# Patient Record
Sex: Male | Born: 1985 | Race: White | Hispanic: No | Marital: Single | Smoking: Never smoker
Health system: Southern US, Community
[De-identification: ages and names within clinical notes are randomized; demographics above are authoritative.]

## PROBLEM LIST (undated history)

## (undated) DIAGNOSIS — R41840 Attention and concentration deficit: Secondary | ICD-10-CM

## (undated) DIAGNOSIS — G4730 Sleep apnea, unspecified: Secondary | ICD-10-CM

## (undated) DIAGNOSIS — S069X9A Unspecified intracranial injury with loss of consciousness of unspecified duration, initial encounter: Secondary | ICD-10-CM

## (undated) DIAGNOSIS — S069XAA Unspecified intracranial injury with loss of consciousness status unknown, initial encounter: Secondary | ICD-10-CM

## (undated) DIAGNOSIS — G4700 Insomnia, unspecified: Secondary | ICD-10-CM

## (undated) HISTORY — PX: WISDOM TOOTH EXTRACTION: SHX21

## (undated) HISTORY — DX: Unspecified intracranial injury with loss of consciousness of unspecified duration, initial encounter: S06.9X9A

## (undated) HISTORY — PX: NOSE SURGERY: SHX723

## (undated) HISTORY — DX: Unspecified intracranial injury with loss of consciousness status unknown, initial encounter: S06.9XAA

## (undated) HISTORY — PX: FEMUR FRACTURE SURGERY: SHX633

---
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
## 2005-10-03 ENCOUNTER — Emergency Department (HOSPITAL_COMMUNITY): Admission: EM | Admit: 2005-10-03 | Discharge: 2005-10-03 | Payer: Self-pay | Admitting: Emergency Medicine

## 2008-01-21 ENCOUNTER — Inpatient Hospital Stay (HOSPITAL_COMMUNITY): Admission: AC | Admit: 2008-01-21 | Discharge: 2008-02-20 | Payer: Self-pay

## 2008-01-30 ENCOUNTER — Encounter (INDEPENDENT_AMBULATORY_CARE_PROVIDER_SITE_OTHER): Payer: Self-pay | Admitting: General Surgery

## 2008-01-30 ENCOUNTER — Ambulatory Visit: Payer: Self-pay | Admitting: Vascular Surgery

## 2008-02-03 ENCOUNTER — Ambulatory Visit: Payer: Self-pay | Admitting: Internal Medicine

## 2008-02-07 ENCOUNTER — Ambulatory Visit: Payer: Self-pay | Admitting: Physical Medicine & Rehabilitation

## 2008-02-20 ENCOUNTER — Ambulatory Visit: Payer: Self-pay | Admitting: Physical Medicine & Rehabilitation

## 2008-02-20 ENCOUNTER — Inpatient Hospital Stay (HOSPITAL_COMMUNITY)
Admission: RE | Admit: 2008-02-20 | Discharge: 2008-04-23 | Payer: Self-pay | Admitting: Physical Medicine & Rehabilitation

## 2008-04-12 ENCOUNTER — Ambulatory Visit: Payer: Self-pay | Admitting: Physical Medicine & Rehabilitation

## 2008-05-08 ENCOUNTER — Encounter: Admission: RE | Admit: 2008-05-08 | Discharge: 2008-05-08 | Payer: Self-pay | Admitting: Neurological Surgery

## 2008-06-27 ENCOUNTER — Encounter
Admission: RE | Admit: 2008-06-27 | Discharge: 2008-09-25 | Payer: Self-pay | Admitting: Physical Medicine and Rehabilitation

## 2008-06-29 ENCOUNTER — Ambulatory Visit (HOSPITAL_COMMUNITY): Admission: RE | Admit: 2008-06-29 | Discharge: 2008-06-29 | Payer: Self-pay | Admitting: General Surgery

## 2008-07-04 ENCOUNTER — Encounter
Admission: RE | Admit: 2008-07-04 | Discharge: 2008-07-06 | Payer: Self-pay | Admitting: Physical Medicine & Rehabilitation

## 2008-07-06 ENCOUNTER — Ambulatory Visit: Payer: Self-pay | Admitting: Physical Medicine & Rehabilitation

## 2008-07-18 ENCOUNTER — Ambulatory Visit: Payer: Self-pay | Admitting: Psychology

## 2008-07-18 ENCOUNTER — Encounter: Admission: RE | Admit: 2008-07-18 | Discharge: 2008-10-16 | Payer: Self-pay | Admitting: Psychology

## 2008-07-24 ENCOUNTER — Encounter: Admission: RE | Admit: 2008-07-24 | Discharge: 2008-07-24 | Payer: Self-pay | Admitting: Neurological Surgery

## 2008-09-17 ENCOUNTER — Encounter: Admission: RE | Admit: 2008-09-17 | Discharge: 2008-10-01 | Payer: Self-pay | Admitting: Family Medicine

## 2008-09-17 ENCOUNTER — Ambulatory Visit: Payer: Self-pay | Admitting: Psychology

## 2008-10-09 ENCOUNTER — Encounter: Admission: RE | Admit: 2008-10-09 | Discharge: 2008-10-09 | Payer: Self-pay | Admitting: Neurological Surgery

## 2008-10-19 ENCOUNTER — Ambulatory Visit (HOSPITAL_COMMUNITY): Admission: RE | Admit: 2008-10-19 | Discharge: 2008-10-19 | Payer: Self-pay | Admitting: Neurological Surgery

## 2008-10-31 ENCOUNTER — Encounter: Admission: RE | Admit: 2008-10-31 | Discharge: 2008-10-31 | Payer: Self-pay | Admitting: Neurological Surgery

## 2008-11-19 ENCOUNTER — Ambulatory Visit (HOSPITAL_COMMUNITY): Admission: RE | Admit: 2008-11-19 | Discharge: 2008-11-19 | Payer: Self-pay | Admitting: Neurological Surgery

## 2009-01-28 IMAGING — CR DG ABD PORTABLE 1V
1 series · 1 of 1 positions shown · non-contrast
Comparison: None.

CLINICAL DATA: Feeding tube placement.

ABDOMEN - 1 VIEW

[view not recorded]
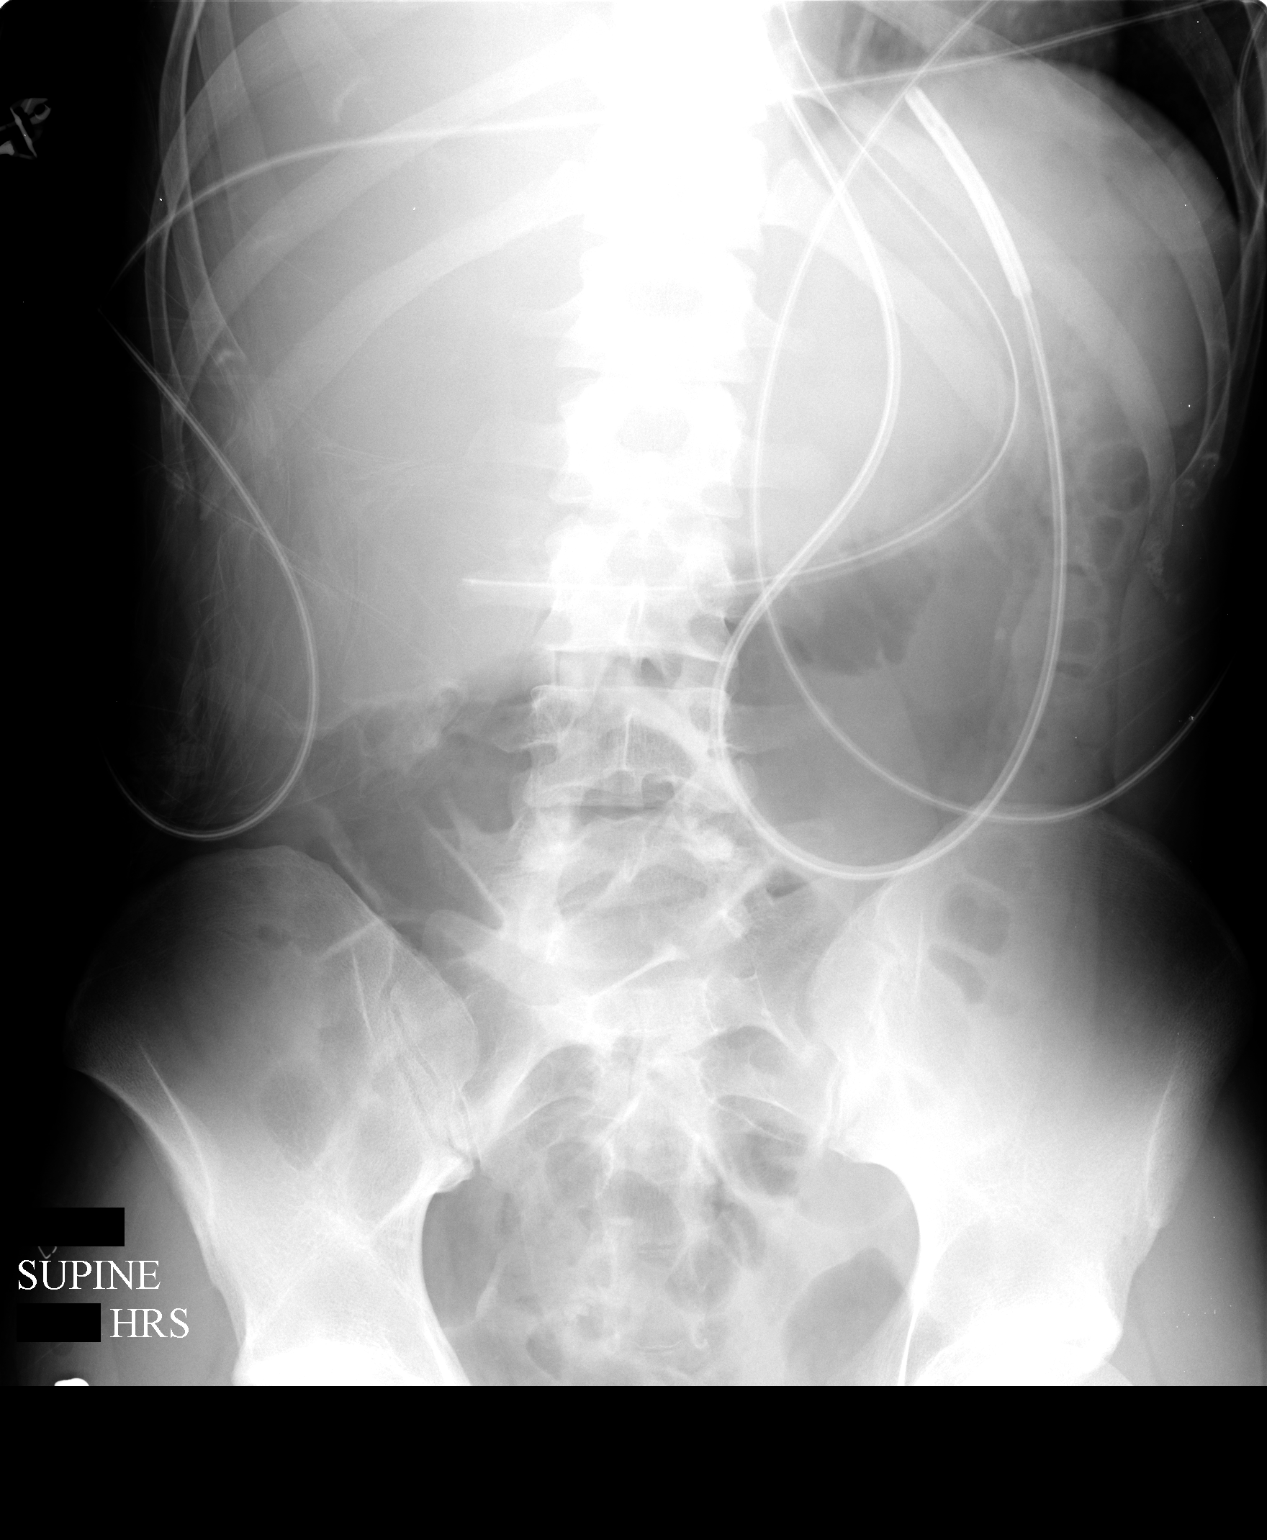

[1 of 1 positions shown; findings below may reference images not displayed]

FINDINGS: Feeding tube is coiled in the stomach.  Nasogastric
terminates in the peripyloric region.
IMPRESSION: Feeding tube coiled in the stomach.
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
## 2009-01-30 IMAGING — CR DG CHEST 1V PORT
1 series · 1 of 1 positions shown · non-contrast
Comparison: 02/01/2008.

CLINICAL DATA: Endotracheal tube placement.

PORTABLE CHEST - 1 VIEW

[view not recorded]
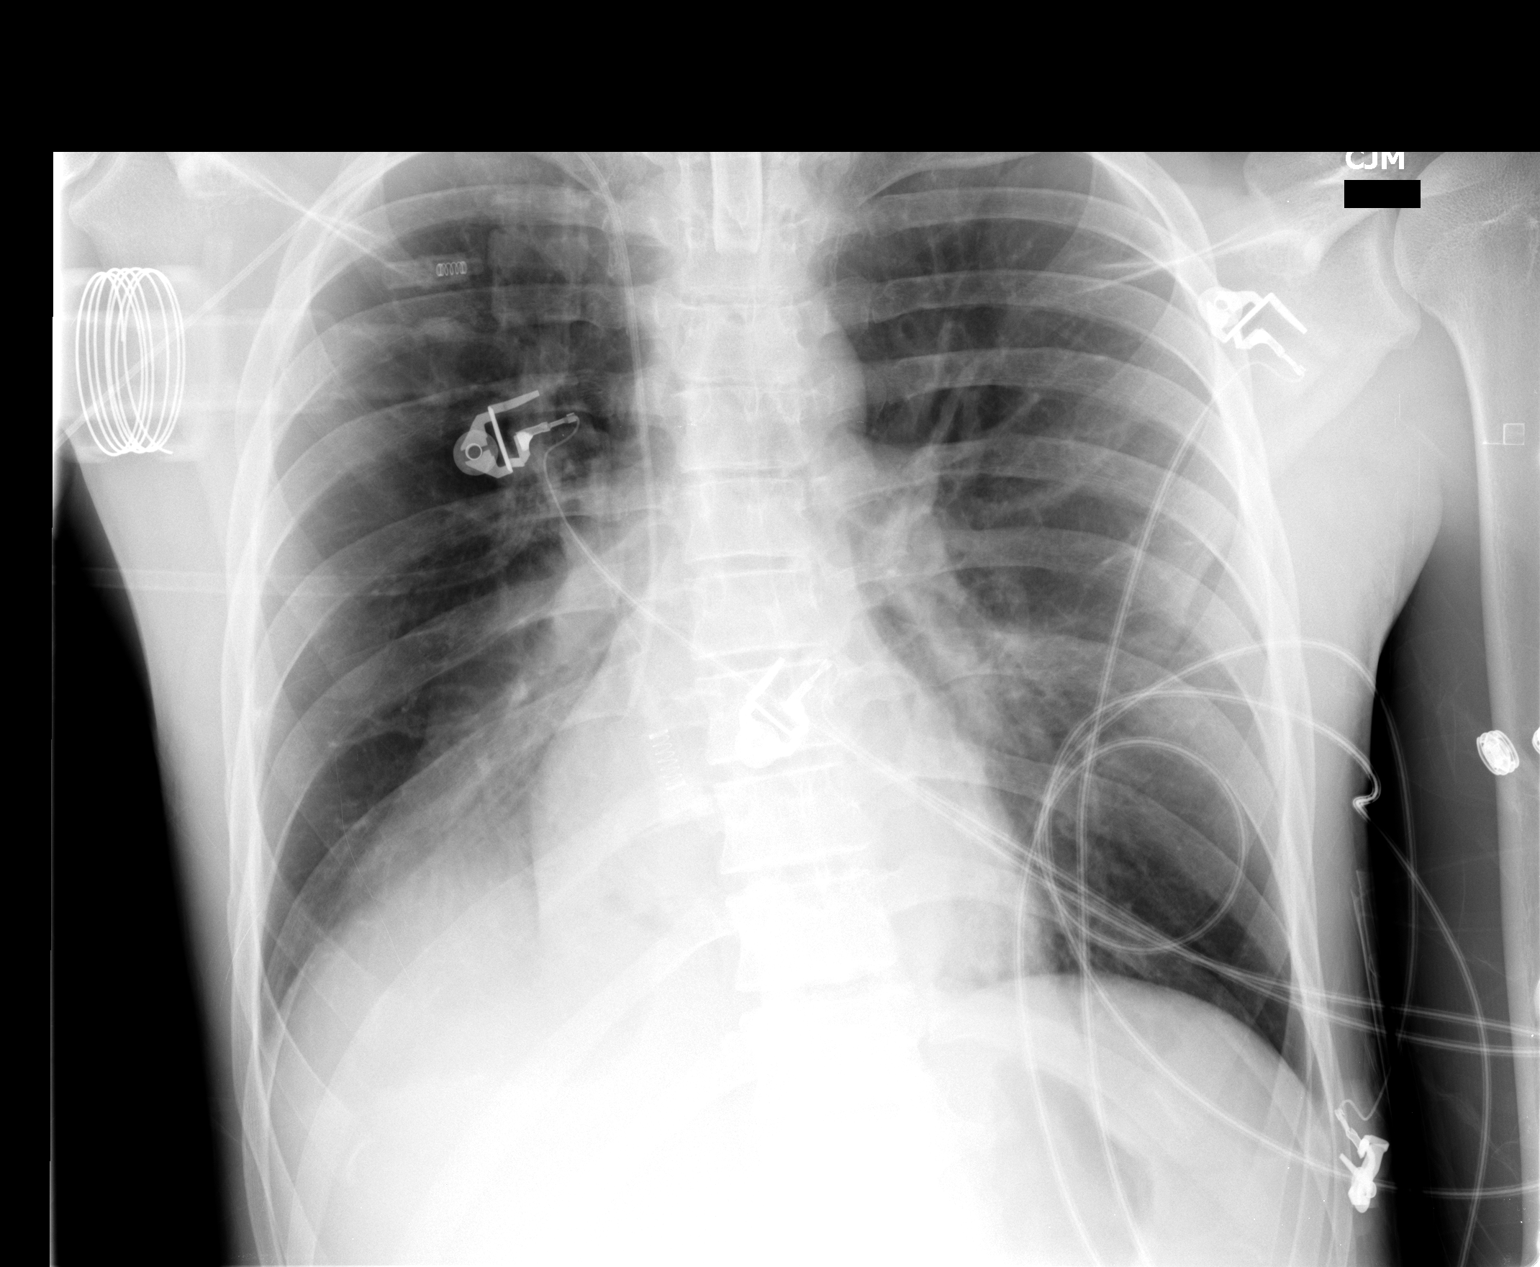

[1 of 1 positions shown; findings below may reference images not displayed]

FINDINGS: Heart size stable.  Right lower lobe
collapse/consolidation.  No pleural fluid.
IMPRESSION: 1.  Interval tracheostomy placement.
2.  Right lower lobe collapse/consolidation.

## 2009-02-01 IMAGING — CR DG CHEST 1V PORT
1 series · 1 of 1 positions shown · non-contrast
Comparison: 02/01/2008

CLINICAL DATA: Intoxication

PORTABLE CHEST - 1 VIEW

[view not recorded]
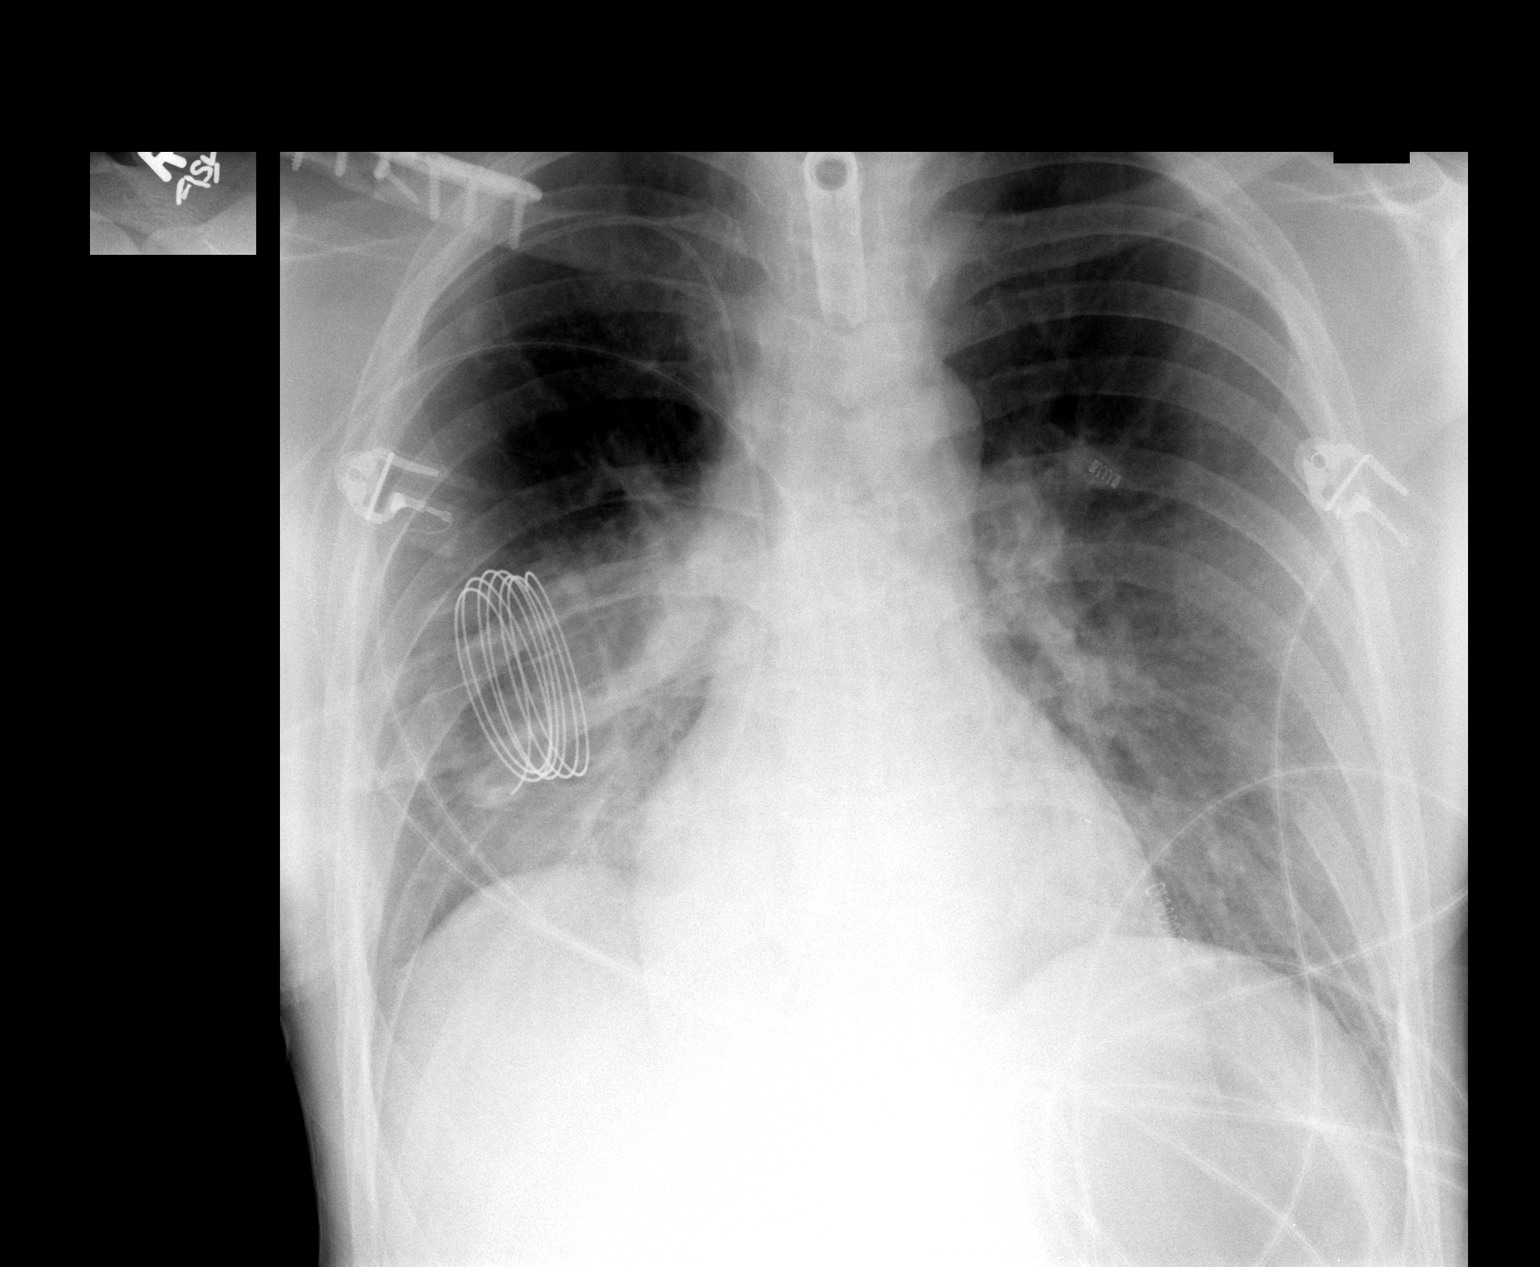

[1 of 1 positions shown; findings below may reference images not displayed]

FINDINGS: Support devices are unchanged.  Continued area of
consolidation in the right lower lobe, not significantly changed.
Mild cardiomegaly.
IMPRESSION: No significant change.

## 2009-02-04 IMAGING — CR DG CHEST 1V PORT
1 series · 1 of 1 positions shown · non-contrast
Comparison: 02/04/2008.

CLINICAL DATA: Head injury.

PORTABLE CHEST - 1 VIEW

[view not recorded]
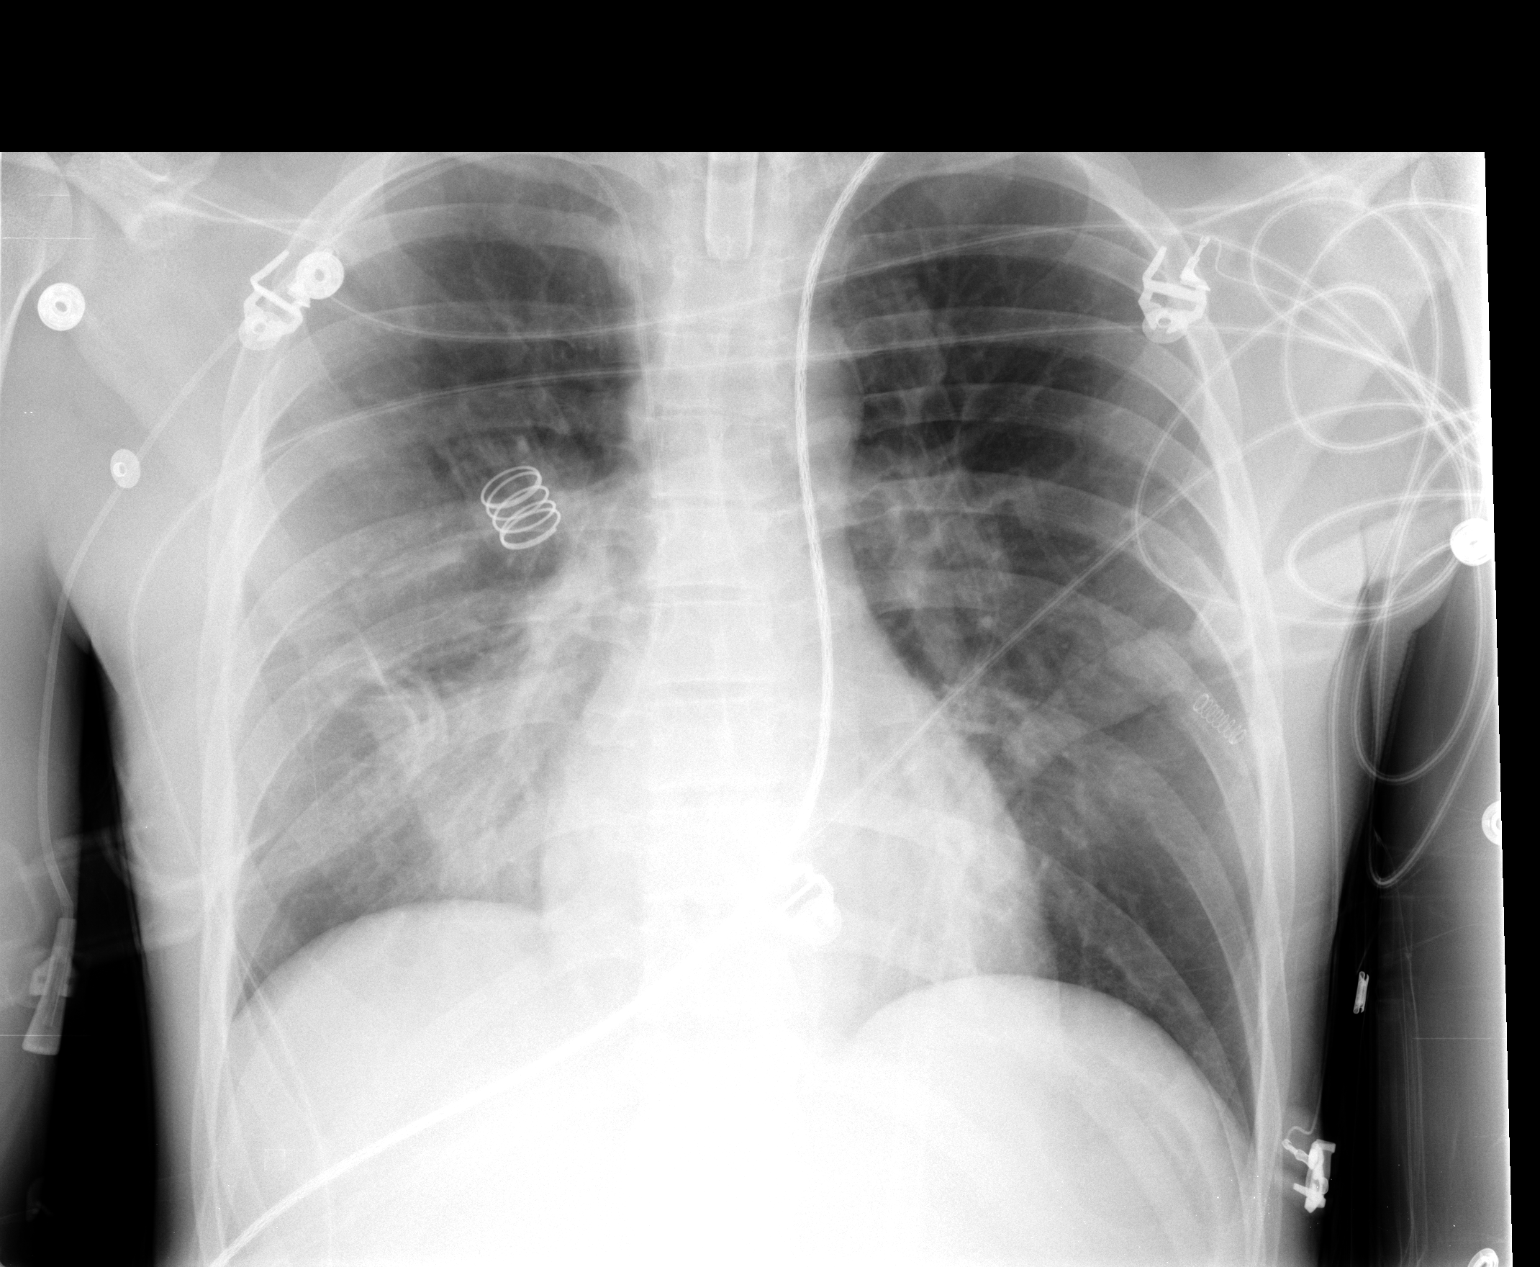

[1 of 1 positions shown; findings below may reference images not displayed]

FINDINGS: Tracheostomy tube tip midline.  Right central line tip
caval atrial junction.  Status post surgery right clavicle.  No
pneumothorax.  Right lower lobe consolidation more apparent than on
the prior exam which may be related to differences in patient
rotation versus slight progression of right lower lobe infiltrate.
Mild central pulmonary vascular prominence.
IMPRESSION: Right lower lobe consolidation more apparent as noted above.

## 2009-03-06 ENCOUNTER — Encounter: Admission: RE | Admit: 2009-03-06 | Discharge: 2009-05-17 | Payer: Self-pay | Admitting: Orthopedic Surgery

## 2009-07-05 ENCOUNTER — Encounter: Admission: RE | Admit: 2009-07-05 | Discharge: 2009-07-05 | Payer: Self-pay | Admitting: Orthopedic Surgery

## 2010-01-03 ENCOUNTER — Ambulatory Visit (HOSPITAL_COMMUNITY): Admission: RE | Admit: 2010-01-03 | Discharge: 2010-01-04 | Payer: Self-pay | Admitting: Orthopedic Surgery

## 2010-01-10 ENCOUNTER — Ambulatory Visit: Payer: Self-pay | Admitting: Psychology

## 2010-02-28 ENCOUNTER — Encounter: Admission: RE | Admit: 2010-02-28 | Discharge: 2010-04-14 | Payer: Self-pay | Admitting: Orthopedic Surgery

## 2010-06-09 ENCOUNTER — Encounter: Admission: RE | Admit: 2010-06-09 | Discharge: 2010-06-09 | Payer: Self-pay | Admitting: Orthopedic Surgery

## 2010-06-30 ENCOUNTER — Encounter: Admission: RE | Admit: 2010-06-30 | Discharge: 2010-07-28 | Payer: Self-pay | Admitting: Orthopedic Surgery

## 2010-11-23 ENCOUNTER — Encounter: Payer: Self-pay | Admitting: Physical Medicine & Rehabilitation

## 2010-11-24 ENCOUNTER — Encounter: Payer: Self-pay | Admitting: Neurological Surgery

## 2011-01-21 LAB — DIFFERENTIAL
Eosinophils Absolute: 0.1 10*3/uL (ref 0.0–0.7)
Eosinophils Relative: 1 % (ref 0–5)
Lymphs Abs: 1.5 10*3/uL (ref 0.7–4.0)
Monocytes Relative: 9 % (ref 3–12)

## 2011-01-21 LAB — CBC
HCT: 43 % (ref 39.0–52.0)
MCV: 92.8 fL (ref 78.0–100.0)
Platelets: 229 10*3/uL (ref 150–400)
RBC: 4.63 MIL/uL (ref 4.22–5.81)
WBC: 6 10*3/uL (ref 4.0–10.5)

## 2011-01-25 LAB — BASIC METABOLIC PANEL
CO2: 31 mEq/L (ref 19–32)
Calcium: 8.4 mg/dL (ref 8.4–10.5)
Creatinine, Ser: 1 mg/dL (ref 0.4–1.5)
Glucose, Bld: 102 mg/dL — ABNORMAL HIGH (ref 70–99)

## 2011-01-25 LAB — CBC
MCHC: 34.6 g/dL (ref 30.0–36.0)
RDW: 13.2 % (ref 11.5–15.5)

## 2011-02-16 LAB — CSF CELL COUNT WITH DIFFERENTIAL
Eosinophils, CSF: 0 % (ref 0–1)
Tube #: 2

## 2011-02-16 LAB — PROTEIN AND GLUCOSE, CSF
Glucose, CSF: 63 mg/dL (ref 43–76)
Total  Protein, CSF: 21 mg/dL (ref 15–45)

## 2011-02-16 LAB — CSF CULTURE W GRAM STAIN: Culture: NO GROWTH
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
## 2011-07-27 LAB — BASIC METABOLIC PANEL
BUN: 10
BUN: 11
BUN: 5 — ABNORMAL LOW
BUN: 6
CO2: 26
CO2: 26
CO2: 27
CO2: 28
CO2: 30
Calcium: 7.2 — ABNORMAL LOW
Calcium: 7.4 — ABNORMAL LOW
Calcium: 7.7 — ABNORMAL LOW
Calcium: 7.7 — ABNORMAL LOW
Calcium: 7.9 — ABNORMAL LOW
Chloride: 100
Chloride: 102
Chloride: 103
Chloride: 107
Creatinine, Ser: 0.65
Creatinine, Ser: 0.67
Creatinine, Ser: 0.78
Creatinine, Ser: 0.81
GFR calc Af Amer: >60
GFR calc Af Amer: >60
GFR calc Af Amer: >60
GFR calc Af Amer: >60
GFR calc Af Amer: >60
GFR calc Af Amer: >60
GFR calc non Af Amer: >60
GFR calc non Af Amer: >60
GFR calc non Af Amer: >60
GFR calc non Af Amer: >60
GFR calc non Af Amer: >60
Glucose, Bld: 104 — ABNORMAL HIGH
Glucose, Bld: 122 — ABNORMAL HIGH
Glucose, Bld: 128 — ABNORMAL HIGH
Glucose, Bld: 129 — ABNORMAL HIGH
Glucose, Bld: 282 — ABNORMAL HIGH
Potassium: 3.6
Potassium: 4.1
Potassium: 4.1
Potassium: 4.3
Sodium: 133 — ABNORMAL LOW
Sodium: 134 — ABNORMAL LOW
Sodium: 136
Sodium: 138
Sodium: 140

## 2011-07-27 LAB — POCT I-STAT 7, (LYTES, BLD GAS, ICA,H+H)
Acid-base deficit: 3 — ABNORMAL HIGH
Calcium, Ion: 0.99 — ABNORMAL LOW
HCT: 21 — ABNORMAL LOW
Hemoglobin: 7.1 — CL
Operator id: 238831
Potassium: 3.4 — ABNORMAL LOW
Sodium: 145
pH, Arterial: 7.368

## 2011-07-27 LAB — BLOOD GAS, ARTERIAL
Acid-Base Excess: 4.1 — ABNORMAL HIGH
Acid-base deficit: 3.8 — ABNORMAL HIGH
Bicarbonate: 25.8 — ABNORMAL HIGH
Drawn by: 24545
Drawn by: 277361
FIO2: 0.3
FIO2: 0.3
MECHVT: 650
MECHVT: 650
MECHVT: 650
O2 Saturation: 99.1
O2 Saturation: 99.6
PEEP: 5
Patient temperature: 100.1
Patient temperature: 98.6
RATE: 14
RATE: 14
TCO2: 26.9
TCO2: 28.6
pCO2 arterial: 37.3
pH, Arterial: 7.363

## 2011-07-27 LAB — CBC
HCT: 20.3 — ABNORMAL LOW
HCT: 22.7 — ABNORMAL LOW
HCT: 24.7 — ABNORMAL LOW
Hemoglobin: 15.1
Hemoglobin: 6.6 — CL
Hemoglobin: 7.1 — CL
Hemoglobin: 7.7 — CL
Hemoglobin: 8 — ABNORMAL LOW
Hemoglobin: 8.6 — ABNORMAL LOW
MCHC: 34.2
MCHC: 34.3
MCHC: 34.7
MCHC: 34.7
MCHC: 34.8
MCHC: 34.9
MCHC: 35
MCHC: 35.1
MCV: 90.3
MCV: 90.6
MCV: 90.9
MCV: 93.2
MCV: 94.5
Platelets: 130 — ABNORMAL LOW
Platelets: 141 — ABNORMAL LOW
Platelets: 156
Platelets: 190
Platelets: 246
Platelets: 442 — ABNORMAL HIGH
RBC: 2.19 — ABNORMAL LOW
RBC: 2.31 — ABNORMAL LOW
RBC: 2.47 — ABNORMAL LOW
RBC: 2.49 — ABNORMAL LOW
RBC: 2.52 — ABNORMAL LOW
RBC: 2.6 — ABNORMAL LOW
RDW: 12.6
RDW: 12.7
RDW: 13.1
RDW: 13.4
RDW: 13.4
RDW: 13.8
RDW: 13.9
RDW: 14.3
WBC: 11.1 — ABNORMAL HIGH
WBC: 12.9 — ABNORMAL HIGH
WBC: 5.9
WBC: 7.5
WBC: 8.6

## 2011-07-27 LAB — TYPE AND SCREEN
ABO/RH(D): A POS
Antibody Screen: NEGATIVE

## 2011-07-27 LAB — PROTIME-INR
INR: 1.4
Prothrombin Time: 17.1 — ABNORMAL HIGH

## 2011-07-27 LAB — URINALYSIS, ROUTINE W REFLEX MICROSCOPIC
Glucose, UA: NEGATIVE
Hgb urine dipstick: NEGATIVE
Nitrite: NEGATIVE
Nitrite: NEGATIVE
Protein, ur: NEGATIVE
Protein, ur: NEGATIVE
Specific Gravity, Urine: 1.013
Urobilinogen, UA: 0.2
Urobilinogen, UA: >8 — ABNORMAL HIGH

## 2011-07-27 LAB — CULTURE, BLOOD (ROUTINE X 2)
Culture: NO GROWTH
Culture: NO GROWTH

## 2011-07-27 LAB — HEPATIC FUNCTION PANEL
ALT: 109 — ABNORMAL HIGH
Alkaline Phosphatase: 128 — ABNORMAL HIGH
Bilirubin, Direct: 0.6 — ABNORMAL HIGH
Indirect Bilirubin: 0.9
Total Bilirubin: 1.5 — ABNORMAL HIGH

## 2011-07-27 LAB — DIFFERENTIAL
Basophils Absolute: 0
Basophils Absolute: 0
Basophils Relative: 0
Eosinophils Absolute: 0
Eosinophils Relative: 2
Eosinophils Relative: 3
Lymphocytes Relative: 8 — ABNORMAL LOW
Lymphocytes Relative: 9 — ABNORMAL LOW
Lymphs Abs: 1
Lymphs Abs: 1.3
Monocytes Absolute: 0.6
Monocytes Relative: 11
Neutrophils Relative %: 75

## 2011-07-27 LAB — CULTURE, BAL-QUANTITATIVE W GRAM STAIN
Colony Count: 80000
Colony Count: >=100000
Gram Stain: NONE SEEN

## 2011-07-27 LAB — OSMOLALITY
Osmolality: 274 — ABNORMAL LOW
Osmolality: 275
Osmolality: 278
Osmolality: 282
Osmolality: 286

## 2011-07-27 LAB — URINE CULTURE
Colony Count: NO GROWTH
Culture: NO GROWTH
Culture: NO GROWTH
Culture: NO GROWTH
Special Requests: NEGATIVE

## 2011-07-27 LAB — POCT I-STAT 4, (NA,K, GLUC, HGB,HCT)
Glucose, Bld: 136 — ABNORMAL HIGH
Operator id: 147011
Potassium: 3.3 — ABNORMAL LOW
Potassium: 3.6
Sodium: 138

## 2011-07-27 LAB — COMPREHENSIVE METABOLIC PANEL
AST: 45 — ABNORMAL HIGH
AST: 78 — ABNORMAL HIGH
Albumin: 2.3 — ABNORMAL LOW
CO2: 26
CO2: 26
Calcium: 7.9 — ABNORMAL LOW
Calcium: 8.3 — ABNORMAL LOW
Creatinine, Ser: 0.71
Creatinine, Ser: 0.79
GFR calc Af Amer: >60
GFR calc Af Amer: >60
GFR calc non Af Amer: >60
GFR calc non Af Amer: >60
Sodium: 137
Total Protein: 4.4 — ABNORMAL LOW

## 2011-07-27 LAB — CULTURE, RESPIRATORY W GRAM STAIN

## 2011-07-27 LAB — POCT CARDIAC MARKERS
Myoglobin, poc: >500
Operator id: 295131

## 2011-07-27 LAB — I-STAT 8, (EC8 V) (CONVERTED LAB)
Glucose, Bld: 121 — ABNORMAL HIGH
Potassium: 3.1 — ABNORMAL LOW
TCO2: 19
pH, Ven: 7.35 — ABNORMAL HIGH

## 2011-07-27 LAB — VANCOMYCIN, TROUGH: Vancomycin Tr: 6.5

## 2011-07-27 LAB — MAGNESIUM
Magnesium: 2.2
Magnesium: 2.2

## 2011-07-27 LAB — POCT I-STAT CREATININE: Creatinine, Ser: 1.1

## 2011-07-27 LAB — CALCIUM, IONIZED: Calcium, Ion: 1.09 — ABNORMAL LOW

## 2011-07-27 LAB — PHOSPHORUS: Phosphorus: 3.8

## 2011-07-28 LAB — BASIC METABOLIC PANEL
BUN: 22
BUN: 17
CO2: 24
CO2: 24
CO2: 28
Calcium: 9.6
Chloride: 101
Chloride: 102
Chloride: 109
Chloride: 102
Chloride: 98
Creatinine, Ser: 0.69
Creatinine, Ser: 0.77
Creatinine, Ser: 0.8
Creatinine, Ser: 0.68
GFR calc Af Amer: >60
GFR calc Af Amer: >60
GFR calc Af Amer: >60
GFR calc Af Amer: >60
GFR calc Af Amer: >60
GFR calc non Af Amer: >60
GFR calc non Af Amer: >60
GFR calc non Af Amer: >60
Glucose, Bld: 97
Potassium: 3.7
Potassium: 3.7
Potassium: 4
Potassium: 4.6
Potassium: 3.4 — ABNORMAL LOW
Potassium: 4.1
Sodium: 138
Sodium: 133 — ABNORMAL LOW
Sodium: 139

## 2011-07-28 LAB — CULTURE, BAL-QUANTITATIVE W GRAM STAIN: Colony Count: >=100000

## 2011-07-28 LAB — COMPREHENSIVE METABOLIC PANEL
ALT: 148 — ABNORMAL HIGH
ALT: 54 — ABNORMAL HIGH
AST: 46 — ABNORMAL HIGH
AST: 42 — ABNORMAL HIGH
AST: 44 — ABNORMAL HIGH
Albumin: 2.4 — ABNORMAL LOW
Alkaline Phosphatase: 193 — ABNORMAL HIGH
Alkaline Phosphatase: 249 — ABNORMAL HIGH
BUN: 20
BUN: 23
BUN: 23
BUN: 16
CO2: 26
CO2: 27
CO2: 31
Calcium: 9.6
Calcium: 9.4
Chloride: 104
Chloride: 104
Chloride: 101
Creatinine, Ser: 0.69
Creatinine, Ser: 0.71
Creatinine, Ser: 0.63
GFR calc Af Amer: >60
GFR calc Af Amer: >60
GFR calc non Af Amer: >60
GFR calc non Af Amer: >60
Glucose, Bld: 119 — ABNORMAL HIGH
Glucose, Bld: 88
Potassium: 3.8
Potassium: 3.9
Sodium: 138
Sodium: 136
Total Bilirubin: 0.9
Total Bilirubin: 1.1
Total Bilirubin: 0.5
Total Protein: 6.5
Total Protein: 7.1
Total Protein: 7.8

## 2011-07-28 LAB — CBC
HCT: 24.5 — ABNORMAL LOW
HCT: 24.9 — ABNORMAL LOW
HCT: 27 — ABNORMAL LOW
HCT: 31 — ABNORMAL LOW
HCT: 32.4 — ABNORMAL LOW
HCT: 34 — ABNORMAL LOW
HCT: 35.2 — ABNORMAL LOW
Hemoglobin: 10 — ABNORMAL LOW
Hemoglobin: 10 — ABNORMAL LOW
Hemoglobin: 10.8 — ABNORMAL LOW
Hemoglobin: 8.3 — ABNORMAL LOW
Hemoglobin: 8.5 — ABNORMAL LOW
Hemoglobin: 9 — ABNORMAL LOW
Hemoglobin: 9.1 — ABNORMAL LOW
Hemoglobin: 10.9 — ABNORMAL LOW
Hemoglobin: 11.7 — ABNORMAL LOW
Hemoglobin: 12.2 — ABNORMAL LOW
MCHC: 33.7
MCHC: 34
MCHC: 34
MCHC: 34.4
MCHC: 34.6
MCV: 90.4
MCV: 90.6
MCV: 90.9
MCV: 91.2
MCV: 91.5
MCV: 88.6
MCV: 89.5
Platelets: 801 — ABNORMAL HIGH
Platelets: 511 — ABNORMAL HIGH
RBC: 2.7 — ABNORMAL LOW
RBC: 2.76 — ABNORMAL LOW
RBC: 2.9 — ABNORMAL LOW
RBC: 2.97 — ABNORMAL LOW
RBC: 3.25 — ABNORMAL LOW
RBC: 3.27 — ABNORMAL LOW
RBC: 3.57 — ABNORMAL LOW
RBC: 3.64 — ABNORMAL LOW
RBC: 3.83 — ABNORMAL LOW
RBC: 3.98 — ABNORMAL LOW
RDW: 14.3
RDW: 14.9
RDW: 15
RDW: 15
WBC: 11.9 — ABNORMAL HIGH
WBC: 12.5 — ABNORMAL HIGH
WBC: 13.1 — ABNORMAL HIGH
WBC: 13.2 — ABNORMAL HIGH
WBC: 14 — ABNORMAL HIGH
WBC: 14.1 — ABNORMAL HIGH
WBC: 16.5 — ABNORMAL HIGH
WBC: 8.8
WBC: 7.9
WBC: 8.8

## 2011-07-28 LAB — DIFFERENTIAL
Basophils Absolute: 0
Eosinophils Absolute: 0.4
Eosinophils Relative: 4
Eosinophils Relative: 5
Lymphocytes Relative: 8 — ABNORMAL LOW
Lymphs Abs: 2
Lymphs Abs: 1.6
Monocytes Absolute: 1
Monocytes Absolute: 1.1 — ABNORMAL HIGH
Monocytes Relative: 6
Monocytes Relative: 9
Monocytes Relative: 14 — ABNORMAL HIGH
Neutro Abs: 13.4 — ABNORMAL HIGH
Neutro Abs: 8.2 — ABNORMAL HIGH
Neutrophils Relative %: 69

## 2011-07-28 LAB — OSMOLALITY
Osmolality: 283
Osmolality: 286
Osmolality: 286
Osmolality: 288
Osmolality: 293

## 2011-07-28 LAB — APTT: aPTT: 32

## 2011-07-28 LAB — FOLATE: Folate: 8.9

## 2011-07-28 LAB — IRON AND TIBC
Saturation Ratios: 12 — ABNORMAL LOW
UIBC: 199

## 2011-07-28 LAB — RETICULOCYTES
RBC.: 2.7 — ABNORMAL LOW
Retic Count, Absolute: 74.7
Retic Ct Pct: 3.1
Retic Ct Pct: 4.2 — ABNORMAL HIGH

## 2011-07-28 LAB — CLOSTRIDIUM DIFFICILE EIA
C difficile Toxins A+B, EIA: NEGATIVE
C difficile Toxins A+B, EIA: NEGATIVE

## 2011-07-28 LAB — VANCOMYCIN, TROUGH: Vancomycin Tr: 13

## 2011-07-28 LAB — CALCIUM, IONIZED: Calcium, Ion: 1.18

## 2011-07-28 LAB — PATHOLOGIST SMEAR REVIEW

## 2011-07-28 LAB — FERRITIN: Ferritin: 356 — ABNORMAL HIGH (ref 22–322)

## 2011-07-29 LAB — BASIC METABOLIC PANEL
BUN: 10
GFR calc non Af Amer: >60
Potassium: 3.9
Sodium: 141

## 2011-07-29 LAB — URINALYSIS, ROUTINE W REFLEX MICROSCOPIC
Glucose, UA: NEGATIVE
Hgb urine dipstick: NEGATIVE
Protein, ur: NEGATIVE
pH: 6

## 2011-07-29 LAB — PREALBUMIN: Prealbumin: 25

## 2011-07-29 LAB — URINE CULTURE: Colony Count: NO GROWTH

## 2011-07-30 LAB — URINE CULTURE
Colony Count: NO GROWTH
Culture: NO GROWTH
Special Requests: NEGATIVE

## 2011-07-30 LAB — URINALYSIS, ROUTINE W REFLEX MICROSCOPIC
Bilirubin Urine: NEGATIVE
Glucose, UA: NEGATIVE
Ketones, ur: NEGATIVE
Nitrite: NEGATIVE
Nitrite: NEGATIVE
Protein, ur: NEGATIVE
Specific Gravity, Urine: 1.021
Urobilinogen, UA: 0.2
pH: 7

## 2011-08-07 LAB — BASIC METABOLIC PANEL
Chloride: 105 mEq/L (ref 96–112)
Creatinine, Ser: 0.91 mg/dL (ref 0.4–1.5)
GFR calc Af Amer: >60 mL/min (ref >60)
GFR calc non Af Amer: >60 mL/min (ref >60)
Potassium: 4.2 mEq/L (ref 3.5–5.1)

## 2011-08-07 LAB — CBC
HCT: 43.5 % (ref 39.0–52.0)
MCHC: 33.3 g/dL (ref 30.0–36.0)
MCV: 91.2 fL (ref 78.0–100.0)
RBC: 4.77 MIL/uL (ref 4.22–5.81)
RDW: 12.8 % (ref 11.5–15.5)
WBC: 5.4 10*3/uL (ref 4.0–10.5)

## 2011-08-07 LAB — DIFFERENTIAL
Basophils Absolute: 0 10*3/uL (ref 0.0–0.1)
Basophils Relative: 1 % (ref 0–1)
Eosinophils Relative: 1 % (ref 0–5)
Monocytes Absolute: 0.5 10*3/uL (ref 0.1–1.0)
Neutro Abs: 3.2 10*3/uL (ref 1.7–7.7)

## 2011-08-07 LAB — ABO/RH: ABO/RH(D): A POS

## 2011-08-07 LAB — APTT: aPTT: 28 seconds (ref 24–37)

## 2013-01-12 ENCOUNTER — Telehealth: Payer: Self-pay | Admitting: Physical Medicine & Rehabilitation

## 2013-01-12 NOTE — Telephone Encounter (Signed)
[facility] apparently talked to Dr. Riley Kill a few nights ago and Dr. Riley Kill told Frank Frye that her son can come in and see him. However, patient has not been seen since 07/06/08 and it was a hospital follow up visit. Does Dr. Riley Kill want patient to be seen and does he need any additional information before we set up the appt.

## 2013-01-13 NOTE — Telephone Encounter (Signed)
Yes please schedule. 

## 2013-02-03 ENCOUNTER — Encounter: Payer: Self-pay | Admitting: Physical Medicine & Rehabilitation

## 2013-02-03 ENCOUNTER — Encounter: Payer: Medicaid Other | Attending: Physical Medicine & Rehabilitation | Admitting: Physical Medicine & Rehabilitation

## 2013-02-03 VITALS — BP 134/69 | HR 77 | Resp 14 | Ht 74.0 in | Wt 165.0 lb

## 2013-02-03 DIAGNOSIS — S069XAS Unspecified intracranial injury with loss of consciousness status unknown, sequela: Secondary | ICD-10-CM | POA: Insufficient documentation

## 2013-02-03 DIAGNOSIS — S069X9S Unspecified intracranial injury with loss of consciousness of unspecified duration, sequela: Secondary | ICD-10-CM

## 2013-02-03 DIAGNOSIS — F411 Generalized anxiety disorder: Secondary | ICD-10-CM | POA: Insufficient documentation

## 2013-02-03 DIAGNOSIS — F068 Other specified mental disorders due to known physiological condition: Secondary | ICD-10-CM | POA: Insufficient documentation

## 2013-02-03 DIAGNOSIS — S069X0S Unspecified intracranial injury without loss of consciousness, sequela: Secondary | ICD-10-CM

## 2013-02-03 DIAGNOSIS — X58XXXS Exposure to other specified factors, sequela: Secondary | ICD-10-CM | POA: Insufficient documentation

## 2013-02-03 DIAGNOSIS — R41840 Attention and concentration deficit: Secondary | ICD-10-CM | POA: Insufficient documentation

## 2013-02-03 DIAGNOSIS — F09 Unspecified mental disorder due to known physiological condition: Secondary | ICD-10-CM

## 2013-02-03 DIAGNOSIS — G4700 Insomnia, unspecified: Secondary | ICD-10-CM | POA: Insufficient documentation

## 2013-02-03 MED ORDER — CLONAZEPAM 0.5 MG PO TABS: 0.2500 mg | ORAL_TABLET | Freq: Every day | ORAL | Status: DC

## 2013-02-03 NOTE — Progress Notes (Signed)
Subjective:    Patient ID: Frank Frye, male    DOB: 1986-07-01, 27 y.o.   MRN: [id number]  HPI  This is a follow up visit for [name] who suffered his TBI 5 years ago. He was on inpatient rehab during the initial injury. Dr. Doristine Counter has been following him primarily over the last few years. His sleep has been problematic depsite multiple medications including ambien, trazodone, melatonin. He was actually seen by a sleep specialist which showed mild sleep apnea. He tried CPAP for 3 months but felt his sleep became even worse.   He doesn't feel that he gets into deep sleep. He's not dreaming and often feels tired in the morning. He will wake up a lot during the night. Typically, he wakes up around 5am for school.  Typically, he tries to go to bed around 9pm. After trying to wind down, he typically is unable to relax and can't fall asleep until 10:30pm.   As far as school is concerned, he is taking classes for aviation, assisted technology from 8am to 2pm which he takes at [employer] [facility] [place] the [place]. The lab portion of his classes have been difficult due to his fatigue, delayed processing, etc. He feels that he struggles to stay alert during the day. However, he doesn't take naps during the day.   He takes trazodone to 200 to 300mg  qhs. This helps him to fall asleep. He recently stopped the [place] because of a paradoxical effect. He takes 5mg  melatonin currently. He was placed on doxepin briefly by his pulmonologist which may help.   He saw an endocrinologist about 2 years ago who checked hormones which were normal.     Pain Inventory Average Pain 3 Pain Right Now 0 My pain is dull  In the last 24 hours, has pain interfered with the following? General activity 2 Relation with others 0 Enjoyment of life 2 What TIME of day is your pain at its worst? evening Sleep (in general) Poor  Pain is worse with: sitting and standing Pain improves with: pacing activities Relief from Meds:  n/a  Mobility walk without assistance how many minutes can you walk? 60 ability to climb steps?  yes do you drive?  yes Do you have any goals in this area?  yes  Function disabled: date disabled school Do you have any goals in this area?  yes  Neuro/Psych tingling spasms confusion depression  Prior Studies sleep study  Physicians involved in your care Bernette   History reviewed. No pertinent family history. History   Social History  . Marital Status: Single    Spouse Name: N/A    Number of Children: N/A  . Years of Education: N/A   Social History Main Topics  . Smoking status: Never Smoker   . Smokeless tobacco: Never Used  . Alcohol Use: Yes  . Drug Use: None  . Sexually Active: None   Other Topics Concern  . None   Social History Narrative  . None   Past Surgical History  Procedure Laterality Date  . Femur fracture surgery    . Nose surgery    . Wisdom tooth extraction     History reviewed. No pertinent past medical history. BP 134/69  Pulse 77  Resp 14  Ht 6\' 2"  (1.88 m)  Wt 165 lb (74.844 kg)  BMI 21.18 kg/m2  SpO2 99%     Review of Systems  Respiratory: Positive for apnea.   Musculoskeletal: Positive for gait problem.  Psychiatric/Behavioral: Positive for  dysphoric mood. The patient is nervous/anxious.   All other systems reviewed and are negative.       Objective:   Physical Exam  General: Alert and oriented x 3, No apparent distress HEENT: Head is normocephalic, atraumatic, PERRLA, EOMI, sclera anicteric, oral mucosa pink and moist, dentition intact, ext ear canals clear,  Neck: Supple without JVD or lymphadenopathy Heart: Reg rate and rhythm. No murmurs rubs or gallops Chest: CTA bilaterally without wheezes, rales, or rhonchi; no distress Abdomen: Soft, non-tender, non-distended, bowel sounds positive. Extremities: No clubbing, cyanosis, or edema. Pulses are 2+ Skin: Clean and intact without signs of breakdown Neuro: pt  with expressive language deficits and processing delays. Some attentional deficits were seen as well. Behavior was normal. Strength was fairly functional with reasonable fine motor skills as well. No balance issues were noted.   Musculoskeletal: Full ROM, No pain with AROM or PROM in the neck, trunk, or extremities. Posture appropriate Psych: Pt's affect is appropriate. Pt is cooperative        Assessment & Plan:  1. 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    Plan: 1. Will try low dose klonopin 0.25mg  to 0.5mg  to assist with anxiety and to help "allow" him to get to sleep. 2. Continue trazodone 300mg  qhs 3. Increase melatonin to 10mg  to improve sleep architecture 4. Check hormonal labs including cortisol, thyroid, growth hormone, testosterone. 5. Follow up with me in one month. 45 minutes of face to face patient care time were spent during this visit. All questions were encouraged and answered.

## 2013-02-03 NOTE — Patient Instructions (Signed)
CALL ME WITH ANY PROBLEMS OR QUESTIONS (#[phone]).  HAVE A GOOD DAY  

## 2013-03-03 ENCOUNTER — Encounter: Payer: Self-pay | Admitting: Neurology

## 2013-03-03 ENCOUNTER — Encounter: Payer: Medicaid Other | Admitting: Physical Medicine & Rehabilitation

## 2013-03-03 ENCOUNTER — Institutional Professional Consult (permissible substitution): Payer: Medicaid Other | Admitting: Neurology

## 2013-03-03 DIAGNOSIS — F0781 Postconcussional syndrome: Secondary | ICD-10-CM | POA: Insufficient documentation

## 2013-03-22 ENCOUNTER — Other Ambulatory Visit: Payer: Medicaid Other

## 2013-03-22 ENCOUNTER — Encounter: Payer: Medicaid Other | Attending: Physical Medicine & Rehabilitation | Admitting: Physical Medicine & Rehabilitation

## 2013-03-22 ENCOUNTER — Telehealth: Payer: Self-pay

## 2013-03-22 ENCOUNTER — Encounter: Payer: Self-pay | Admitting: Physical Medicine & Rehabilitation

## 2013-03-22 VITALS — BP 117/61 | HR 75 | Resp 14 | Ht 72.0 in | Wt 163.0 lb

## 2013-03-22 DIAGNOSIS — S069XAS Unspecified intracranial injury with loss of consciousness status unknown, sequela: Secondary | ICD-10-CM

## 2013-03-22 DIAGNOSIS — F068 Other specified mental disorders due to known physiological condition: Secondary | ICD-10-CM

## 2013-03-22 DIAGNOSIS — X58XXXS Exposure to other specified factors, sequela: Secondary | ICD-10-CM | POA: Insufficient documentation

## 2013-03-22 DIAGNOSIS — G4700 Insomnia, unspecified: Secondary | ICD-10-CM

## 2013-03-22 DIAGNOSIS — F0781 Postconcussional syndrome: Secondary | ICD-10-CM

## 2013-03-22 DIAGNOSIS — S069X9S Unspecified intracranial injury with loss of consciousness of unspecified duration, sequela: Secondary | ICD-10-CM

## 2013-03-22 DIAGNOSIS — E230 Hypopituitarism: Secondary | ICD-10-CM

## 2013-03-22 DIAGNOSIS — F09 Unspecified mental disorder due to known physiological condition: Secondary | ICD-10-CM

## 2013-03-22 NOTE — Patient Instructions (Addendum)
CALL ME WITH ANY PROBLEMS OR QUESTIONS (#[phone]).  HAVE A GOOD DAY   TAKE 30MG  ADDERALL DAILY AND OBSERVE FOR EFFECT

## 2013-03-22 NOTE — Progress Notes (Signed)
  Subjective:    Patient ID: Frank Frye, male    DOB: 24-May-1986, 27 y.o.   MRN: [id number]  HPI  [name] is back regarding his TBI and associated deficits, including insomnia. The klonopin has seemed to assist his sleep cycle a bit. He also has done better now that he's finished up with school. His school counselor/teacher apparently made the comment that he did better while auditing the class than he did while taking the class(es) for a grade this spring.  He has stopped taking his adderall for the most part now that school has finished. He did not increase the melatonin  I reviewed his labs, which were JUST faxed over here. Essentially, the only abnormality was his GH which was <0.1. His thyroid, cortisol, testosterone were all WNL.   Pain Inventory  Average Pain 3  Pain Right Now 0  My pain is dull  In the last 24 hours, has pain interfered with the following?  General activity 2  Relation with others 0  Enjoyment of life 2  What TIME of day is your pain at its worst? evening  Sleep (in general) Poor  Pain is worse with: sitting and standing  Pain improves with: pacing activities  Relief from Meds: n/a  Mobility  walk without assistance  how many minutes can you walk? 60  ability to climb steps? yes  do you drive? yes  Do you have any goals in this area? yes  Function  disabled: date disabled school  Do you have any goals in this area? yes  Neuro/Psych  tingling  spasms  confusion  depression  Prior Studies  sleep study  Physicians involved in your care  Bernette      Review of Systems     Objective:   Physical Exam  General: Alert and oriented x 3, No apparent distress  HEENT: Head is normocephalic, atraumatic, PERRLA, EOMI, sclera anicteric, oral mucosa pink and moist, dentition intact, ext ear canals clear,  Neck: Supple without JVD or lymphadenopathy  Heart: Reg rate and rhythm. No murmurs rubs or gallops  Chest: CTA bilaterally without wheezes,  rales, or rhonchi; no distress  Abdomen: Soft, non-tender, non-distended, bowel sounds positive.  Extremities: No clubbing, cyanosis, or edema. Pulses are 2+  Skin: Clean and intact without signs of breakdown  Neuro: pt with expressive language deficits and processing delays. Some attentional deficits were seen as well. Behavior was normal. Strength was fairly functional with reasonable fine motor skills as well. No balance issues were noted.  Musculoskeletal: Full ROM, No pain with AROM or PROM in the neck, trunk, or extremities. Posture appropriate  Psych: Pt's affect is appropriate. Pt is cooperative   Assessment & Plan:   1. 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  3. 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 Continue with  klonopin 0.25mg  to 0.5mg  to assist with anxiety and sleep. 2. Continue trazodone 300mg  qhs  3. Continue with melatonin to improve sleep architecture as well. 4. Follow up labwork: GH stimulating test was ordered.  5. Discussed increasing the adderall to 30mg  for arousal/attention for the time being and gage its effect.  6. Follow up with me in one month. 30 minutes of face to face patient care time were spent during this visit. All questions were encouraged and answered.

## 2013-03-22 NOTE — Telephone Encounter (Signed)
Patients mother called, they are at [facility] to have growth stimulator hormone done.  Per patients mother the facility cannot do this.  Advised mother to go home and we would look into this further and contact them.

## 2013-03-23 NOTE — Telephone Encounter (Signed)
Left message for patients mother saying [facility] on [address] can do the blood work.  They don't have any indications that show an injection needs to be given.

## 2013-03-30 ENCOUNTER — Telehealth: Payer: Self-pay

## 2013-03-30 DIAGNOSIS — F068 Other specified mental disorders due to known physiological condition: Secondary | ICD-10-CM

## 2013-03-30 DIAGNOSIS — R4189 Other symptoms and signs involving cognitive functions and awareness: Secondary | ICD-10-CM

## 2013-03-30 DIAGNOSIS — G4700 Insomnia, unspecified: Secondary | ICD-10-CM

## 2013-03-30 MED ORDER — CLONAZEPAM 0.5 MG PO TABS: 0.2500 mg | ORAL_TABLET | Freq: Every day | ORAL | Status: DC

## 2013-03-30 NOTE — Telephone Encounter (Signed)
Patient needs clonazepam refill.  Clonazepam called in.  Also having trouble finding a place that does growth hormone stimulation test.  I have called [facility] and a few endocrinologist, none can do this. Patients mother is wanting to get this done.  Please advise.  Left message with patient mother that clonazepam was called in.

## 2013-03-30 NOTE — Telephone Encounter (Signed)
i will have to contact an endocrinologist. He will have to hold tight for now

## 2013-04-05 NOTE — Telephone Encounter (Signed)
Patient has seen an endocrinologist at [facility].  His mother is going to contact them to see if they can do the stimulation test.

## 2013-04-27 ENCOUNTER — Telehealth: Payer: Self-pay

## 2013-04-27 DIAGNOSIS — E230 Hypopituitarism: Secondary | ICD-10-CM

## 2013-04-27 DIAGNOSIS — F068 Other specified mental disorders due to known physiological condition: Secondary | ICD-10-CM

## 2013-04-27 NOTE — Telephone Encounter (Signed)
Patients mother called to follow up on growth hormone stimulation test.  She contacted an endocrinologist at [facility] but they do not do this.  She said they recommended a Dr Nonie Hoyer at [facility] endocrinology.  [facility] [phone] to see if this was something they do.  Dr Sidney Ace at this facility may do this but the patient would need referred and then have a consult to determine what exactly he would need.  Medical records and labs can be faxed to [phone] if a referral is appropriate.  So what do you want to do at this point?

## 2013-04-28 NOTE — Telephone Encounter (Signed)
I spoke with Dr. Sidney Ace who is willing to see pt for an evaluation. I left a VM with Mrs. Liby to let her know I'll make the referral. I assume that they'll want to pursue the evaluation.

## 2013-05-02 ENCOUNTER — Encounter: Payer: Medicaid Other | Admitting: Physical Medicine & Rehabilitation

## 2013-06-02 ENCOUNTER — Telehealth: Payer: Self-pay | Admitting: Physical Medicine & Rehabilitation

## 2013-06-02 NOTE — Telephone Encounter (Signed)
Opened in error.  See 2nd telephone encounter for today.

## 2013-06-02 NOTE — Telephone Encounter (Signed)
Needs refill of Klonapin and amphetamine salts.  Frank Frye will be out tomorrow.  Please let him know when it has bee called in to the pharmacy.

## 2013-06-02 NOTE — Telephone Encounter (Signed)
Left message for patient to call office to make appointment for medication refills since last appointment was in may and he was to follow up in a month.

## 2013-06-05 ENCOUNTER — Encounter: Payer: Medicaid Other | Attending: Physical Medicine & Rehabilitation | Admitting: Physical Medicine & Rehabilitation

## 2013-06-05 ENCOUNTER — Encounter: Payer: Self-pay | Admitting: Physical Medicine & Rehabilitation

## 2013-06-05 VITALS — BP 133/74 | HR 74 | Resp 14 | Ht 74.0 in | Wt 158.4 lb

## 2013-06-05 DIAGNOSIS — G4700 Insomnia, unspecified: Secondary | ICD-10-CM | POA: Insufficient documentation

## 2013-06-05 DIAGNOSIS — R41840 Attention and concentration deficit: Secondary | ICD-10-CM | POA: Insufficient documentation

## 2013-06-05 DIAGNOSIS — X58XXXS Exposure to other specified factors, sequela: Secondary | ICD-10-CM | POA: Insufficient documentation

## 2013-06-05 DIAGNOSIS — S069XAS Unspecified intracranial injury with loss of consciousness status unknown, sequela: Secondary | ICD-10-CM | POA: Insufficient documentation

## 2013-06-05 DIAGNOSIS — S069X9S Unspecified intracranial injury with loss of consciousness of unspecified duration, sequela: Secondary | ICD-10-CM

## 2013-06-05 DIAGNOSIS — F0781 Postconcussional syndrome: Secondary | ICD-10-CM

## 2013-06-05 DIAGNOSIS — Z79899 Other long term (current) drug therapy: Secondary | ICD-10-CM | POA: Insufficient documentation

## 2013-06-05 DIAGNOSIS — F411 Generalized anxiety disorder: Secondary | ICD-10-CM | POA: Insufficient documentation

## 2013-06-05 DIAGNOSIS — F068 Other specified mental disorders due to known physiological condition: Secondary | ICD-10-CM

## 2013-06-05 DIAGNOSIS — F09 Unspecified mental disorder due to known physiological condition: Secondary | ICD-10-CM

## 2013-06-05 DIAGNOSIS — E230 Hypopituitarism: Secondary | ICD-10-CM

## 2013-06-05 MED ORDER — TRAZODONE HCL 100 MG PO TABS: 100.0000 mg | ORAL_TABLET | Freq: Every day | ORAL | Status: DC

## 2013-06-05 MED ORDER — CLONAZEPAM 0.5 MG PO TABS: 0.2500 mg | ORAL_TABLET | Freq: Every day | ORAL | Status: DC

## 2013-06-05 MED ORDER — AMPHETAMINE-DEXTROAMPHETAMINE 20 MG PO TABS: 20.0000 mg | ORAL_TABLET | Freq: Every day | ORAL | Status: DC

## 2013-06-05 NOTE — Patient Instructions (Signed)
PLEASE TAKE A FULL KLONOPIN

## 2013-06-05 NOTE — Progress Notes (Signed)
Subjective:    Patient ID: DOSSIE SWOR, male    DOB: 1986/09/07, 27 y.o.   MRN: [id number]  HPI Frank Frye is back regarding his TBI and insomnia. The combination of medications we placed him on seemed to help with this sleep up until a few weeks ago. He is going to sleep around 10pm and waking up around 0430 typically.   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 He's deciding what he will do going forward from a vocational standpoint.    Pain Inventory Average Pain 2 Pain Right Now 0 My pain is dull  In the last 24 hours, has pain interfered with the following? General activity 0 Relation with others 0 Enjoyment of life 0 What TIME of day is your pain at its worst? evening Sleep (in general) Poor  Pain is worse with: sitting and standing Pain improves with: rest Relief from Meds: 7  Mobility walk without assistance  Function not employed: date last employed 2009  Neuro/Psych numbness tingling  Prior Studies Any changes since last visit?  no  Physicians involved in your care Any changes since last visit?  no   History reviewed. No pertinent family history. History   Social History  . Marital Status: Single    Spouse Name: N/A    Number of Children: N/A  . Years of Education: N/A   Social History Main Topics  . Smoking status: Never Smoker   . Smokeless tobacco: Never Used  . Alcohol Use: Yes  . Drug Use: None  . Sexually Active: None   Other Topics Concern  . None   Social History Narrative  . None   Past Surgical History  Procedure Laterality Date  . Femur fracture surgery    . Nose surgery    . Wisdom tooth extraction     Past Medical History  Diagnosis Date  . Traumatic brain injury     2009   BP 133/74  Pulse 74  Resp 14  Ht 6\' 2"  (1.88 m)  Wt 158 lb 6.4 oz (71.85 kg)  BMI 20.33 kg/m2  SpO2 98%     Review of Systems  Neurological: Positive for numbness.       Tingling  All other systems reviewed and are  negative.       Objective:   Physical Exam General: Alert and oriented x 3, No apparent distress  HEENT: Head is normocephalic, atraumatic, PERRLA, EOMI, sclera anicteric, oral mucosa pink and moist, dentition intact, ext ear canals clear,  Neck: Supple without JVD or lymphadenopathy  Heart: Reg rate and rhythm. No murmurs rubs or gallops  Chest: CTA bilaterally without wheezes, rales, or rhonchi; no distress  Abdomen: Soft, non-tender, non-distended, bowel sounds positive.  Extremities: No clubbing, cyanosis, or edema. Pulses are 2+  Skin: Clean and intact without signs of breakdown  Neuro: pt with expressive language deficits and processing delays. Some attentional deficits were seen as well. Behavior was normal. Strength was fairly functional with reasonable fine motor skills as well. No balance issues were noted.  Musculoskeletal: Full ROM, No pain with AROM or PROM in the neck, trunk, or extremities. Posture appropriate  Psych: Pt's affect is appropriate. Pt is cooperative  Assessment & Plan:   1. 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  3. 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 Continue with klonopin 0.25mg  to 0.5mg  to assist with anxiety  and sleep. Recommend taking a full pill to improve sleep. Discussed a possible referral to a sleep specialist. Another consideration would be a trial of a low dose antipsychotic such as seroquel for sleep. A factor playing into his waking time is likely his prior school schedule, and i don't think this can be understated. He's had an adequate trial of CPAP in the past as well so I will not push this. 2. Continue trazodone 300mg  qhs---refilled today. 3. Continue with melatonin to improve sleep architecture as well.  4. Growth hormone follow up with endocrinology at [facility] for a stim test.  5. Adderall was refilled today.  6. Follow up with me in 3 month.  30 minutes of face to face patient care time were spent during this visit. All questions were encouraged and answered.

## 2013-06-06 ENCOUNTER — Telehealth: Payer: Self-pay | Admitting: *Deleted

## 2013-06-06 MED ORDER — AMPHETAMINE-DEXTROAMPHETAMINE 20 MG PO TABS: 30.0000 mg | ORAL_TABLET | Freq: Every day | ORAL | Status: DC

## 2013-06-06 MED ORDER — TRAZODONE HCL 100 MG PO TABS: ORAL_TABLET | ORAL | Status: DC

## 2013-06-06 NOTE — Telephone Encounter (Signed)
Directions on Trazodone need to corrected and resent to the pharmacy, clonazepam needs to be called in, and the adderall rx has not been taken to the pharmacy but it will need corrections as well. The standard format for adderall is one daily but Dr Riley Kill wants him to take 1.5 daily.  Disp amt was only 30 amd should be 45.  Corrections will be made, Trazodone electronically, clonazepam called in, and adderall rx corrected and reprinted for pickup.

## 2013-06-06 NOTE — Telephone Encounter (Signed)
Notified Frank Frye mother that new rx will be available for pick up and to bring old rx to be destroyed.Marland Kitchen

## 2013-09-05 ENCOUNTER — Ambulatory Visit: Payer: Medicaid Other | Admitting: Physical Medicine & Rehabilitation

## 2013-09-11 ENCOUNTER — Encounter: Payer: Self-pay | Admitting: Physical Medicine & Rehabilitation

## 2013-09-11 ENCOUNTER — Encounter: Payer: Medicaid Other | Attending: Physical Medicine & Rehabilitation | Admitting: Physical Medicine & Rehabilitation

## 2013-09-11 VITALS — BP 114/75 | HR 74 | Resp 14 | Ht 74.0 in | Wt 153.0 lb

## 2013-09-11 DIAGNOSIS — X58XXXS Exposure to other specified factors, sequela: Secondary | ICD-10-CM | POA: Insufficient documentation

## 2013-09-11 DIAGNOSIS — F068 Other specified mental disorders due to known physiological condition: Secondary | ICD-10-CM

## 2013-09-11 DIAGNOSIS — E230 Hypopituitarism: Secondary | ICD-10-CM

## 2013-09-11 DIAGNOSIS — S069XAS Unspecified intracranial injury with loss of consciousness status unknown, sequela: Secondary | ICD-10-CM

## 2013-09-11 DIAGNOSIS — F0781 Postconcussional syndrome: Secondary | ICD-10-CM

## 2013-09-11 DIAGNOSIS — F09 Unspecified mental disorder due to known physiological condition: Secondary | ICD-10-CM

## 2013-09-11 DIAGNOSIS — S069X9S Unspecified intracranial injury with loss of consciousness of unspecified duration, sequela: Secondary | ICD-10-CM

## 2013-09-11 DIAGNOSIS — G4700 Insomnia, unspecified: Secondary | ICD-10-CM

## 2013-09-11 MED ORDER — CLONAZEPAM 1 MG PO TABS: 1.0000 mg | ORAL_TABLET | Freq: Two times a day (BID) | ORAL | Status: DC

## 2013-09-11 MED ORDER — CLONAZEPAM 1 MG PO TABS: 1.0000 mg | ORAL_TABLET | Freq: Every day | ORAL | Status: DC

## 2013-09-11 MED ORDER — AMPHETAMINE-DEXTROAMPHETAMINE 20 MG PO TABS: 30.0000 mg | ORAL_TABLET | Freq: Every day | ORAL | Status: DC

## 2013-09-11 NOTE — Progress Notes (Signed)
Subjective:    Patient ID: Frank Frye, male    DOB: 07-Aug-1986, 27 y.o.   MRN: [id number]  HPI  Frank Frye is back regarding his TBI. His Growth Hormone testing was unremarkable.   He is complaining of left wrist pain for the last 6 weeks or so. He doesn't recall any initiating event.   His sleep remains an issue. The increase to 0.5mg  of klonopin was helpful initially but the effects since have wained.   He has been taking a cooking class at church and he's been back at [facility] for Speech therapy.   He has been involved with voc rehab in the past, but he hasn't been back to re-initiate.   Pain Inventory Average Pain 2 Pain Right Now 0 My pain is dull  In the last 24 hours, has pain interfered with the following? General activity 0 Relation with others 0 Enjoyment of life 0 What TIME of day is your pain at its worst? evening Sleep (in general) Poor  Pain is worse with: some activites Pain improves with: rest Relief from Meds: 7  Mobility walk without assistance  Function not employed: date last employed 2009  Neuro/Psych numbness tingling  Prior Studies Any changes since last visit?  no  Physicians involved in your care Any changes since last visit?  no   History reviewed. No pertinent family history. History   Social History  . Marital Status: Single    Spouse Name: N/A    Number of Children: N/A  . Years of Education: N/A   Social History Main Topics  . Smoking status: Never Smoker   . Smokeless tobacco: Never Used  . Alcohol Use: Yes  . Drug Use: None  . Sexual Activity: None   Other Topics Concern  . None   Social History Narrative  . None   Past Surgical History  Procedure Laterality Date  . Femur fracture surgery    . Nose surgery    . Wisdom tooth extraction     Past Medical History  Diagnosis Date  . Traumatic brain injury     2009   BP 114/75  Pulse 74  Resp 14  Ht 6\' 2"  (1.88 m)  Wt 153 lb (69.4 kg)  BMI 19.64 kg/m2   SpO2 99%     Review of Systems  Neurological: Positive for numbness.  All other systems reviewed and are negative.       Objective:   Physical Exam General: Alert and oriented x 3, No apparent distress  HEENT: Head is normocephalic, atraumatic, PERRLA, EOMI, sclera anicteric, oral mucosa pink and moist, dentition intact, ext ear canals clear,  Neck: Supple without JVD or lymphadenopathy  Heart: Reg rate and rhythm. No murmurs rubs or gallops  Chest: CTA bilaterally without wheezes, rales, or rhonchi; no distress  Abdomen: Soft, non-tender, non-distended, bowel sounds positive.  Extremities: No clubbing, cyanosis, or edema. Pulses are 2+  Skin: Clean and intact without signs of breakdown  Neuro: pt with persistent. expressive language deficits and processing delays. Some attentional deficits were seen as well. Behavior was normal. Strength was fairly functional with reasonable fine motor skills as well. No balance issues were noted.  Musculoskeletal: Full ROM, No pain with AROM or PROM in the neck, trunk, or extremities. Posture appropriate. There was no tenderness, swelling, or deformities at the left wrist upon provocative examination today.  Psych: Pt's affect is appropriate. Pt is cooperative. He sometimes appears depressed to me. Assessment & Plan:   1. Late  effects of TBI (2009) with attention and processing deficits  2. 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  3. 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 Will increase his klonopin to 1mg  qhs.  Consider a trial of a low dose antipsychotic such as seroquel for sleep.  2. Continue trazodone 300mg  qhs---refilled today.  3. Continue with melatonin to improve sleep architecture as well.  4.  I would like [name] to participate in more community/interactive activities. He is starting up with voc rehab again as well. 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 He could  meet a lot of people, get out of the house, and get some exercise too. 5. Adderall was refilled today.  6. Follow up with me in 3 months. 30 minutes of face to face patient care time were spent during this visit. All questions were encouraged and answered.

## 2013-09-11 NOTE — Patient Instructions (Signed)
CALL ME WITH ANY PROBLEMS OR QUESTIONS (#[phone]).  HAVE A GOOD DAY  

## 2013-11-26 ENCOUNTER — Other Ambulatory Visit: Payer: Self-pay | Admitting: Physical Medicine & Rehabilitation

## 2013-11-27 ENCOUNTER — Other Ambulatory Visit: Payer: Self-pay | Admitting: Physical Medicine & Rehabilitation

## 2013-12-12 ENCOUNTER — Encounter: Payer: Medicaid Other | Attending: Physical Medicine & Rehabilitation | Admitting: Physical Medicine & Rehabilitation

## 2013-12-12 ENCOUNTER — Encounter: Payer: Self-pay | Admitting: Physical Medicine & Rehabilitation

## 2013-12-12 VITALS — BP 117/73 | HR 87 | Resp 14 | Ht 74.0 in | Wt 156.0 lb

## 2013-12-12 DIAGNOSIS — S069X0S Unspecified intracranial injury without loss of consciousness, sequela: Secondary | ICD-10-CM

## 2013-12-12 DIAGNOSIS — S069XAS Unspecified intracranial injury with loss of consciousness status unknown, sequela: Secondary | ICD-10-CM | POA: Insufficient documentation

## 2013-12-12 DIAGNOSIS — F0781 Postconcussional syndrome: Secondary | ICD-10-CM

## 2013-12-12 DIAGNOSIS — X58XXXS Exposure to other specified factors, sequela: Secondary | ICD-10-CM | POA: Insufficient documentation

## 2013-12-12 DIAGNOSIS — F068 Other specified mental disorders due to known physiological condition: Secondary | ICD-10-CM

## 2013-12-12 DIAGNOSIS — F09 Unspecified mental disorder due to known physiological condition: Secondary | ICD-10-CM | POA: Insufficient documentation

## 2013-12-12 DIAGNOSIS — S069X9S Unspecified intracranial injury with loss of consciousness of unspecified duration, sequela: Secondary | ICD-10-CM | POA: Insufficient documentation

## 2013-12-12 DIAGNOSIS — G4700 Insomnia, unspecified: Secondary | ICD-10-CM

## 2013-12-12 DIAGNOSIS — R4189 Other symptoms and signs involving cognitive functions and awareness: Secondary | ICD-10-CM

## 2013-12-12 MED ORDER — CLONAZEPAM 1 MG PO TABS: 1.0000 mg | ORAL_TABLET | Freq: Every day | ORAL | Status: DC

## 2013-12-12 NOTE — Patient Instructions (Addendum)
CONTINUE WORKING ON GETTING OUT OF THE HOUSE. I WANT YOU GOING TO THE GYM A COUPLE DAYS OF WEEK TO WALK OR USE THE STATIONARY BIKE OR WHATEVER!   VOLUNTEERING MIGHT BE GOOD FOR YOU ALSO. I CAN CONTACT [place] IF YOU WOULD LIKE. THE [employer] WOULD ALSO BE A GOOD OPPORTUNITY FOR VOLUNTEER WORK AS WELL.   CONTINUE WITH YOUR ACTIVITIES AT [facility] AS WELL!!

## 2013-12-12 NOTE — Progress Notes (Signed)
Subjective:    Patient ID: Frank Frye, male    DOB: 1986-06-17, 28 y.o.   MRN: [id number]  HPI  [name] is back regarding his TBI. He is working with [employer] on vocational re-entry. 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   He remains active in his church. He is interested in volunteering at the church perhaps.   From a sleep standpoint, his sleep pattern seems better. We increased his klonopin to 1mg  at bed time and this seems to work well. He's getting about 9 hours per day. He will typically read the bible in the morning as a routine. He hasn't been routinely exercising.   On thursdays he goes to [facility] to work on [employer], Medical sales representative, Catering manager.   His mood is good.       Pain Inventory Average Pain 3 Pain Right Now 2 My pain is intermittent and dull  In the last 24 hours, has pain interfered with the following? General activity 2 Relation with others 2 Enjoyment of life 2 What TIME of day is your pain at its worst? varies Sleep (in general) Fair  Pain is worse with: exercising Pain improves with: rest Relief from Meds: 0  Mobility walk without assistance ability to climb steps?  yes do you drive?  yes transfers alone Do you have any goals in this area?  yes  Function disabled: date disabled na Do you have any goals in this area?  yes  Neuro/Psych weakness numbness tingling  Prior Studies Any changes since last visit?  no  Physicians involved in your care Any changes since last visit?  no   History reviewed. No pertinent family history. History   Social History  . Marital Status: Single    Spouse Name: N/A    Number of Children: N/A  . Years of Education: N/A   Social History Main Topics  . Smoking status: Never Smoker   . Smokeless tobacco: Never Used  . Alcohol Use: Yes  . Drug Use: None  . Sexual Activity: None   Other Topics Concern  . None   Social History Narrative  . None   Past Surgical  History  Procedure Laterality Date  . Femur fracture surgery    . Nose surgery    . Wisdom tooth extraction     Past Medical History  Diagnosis Date  . Traumatic brain injury     2009   BP 117/73  Pulse 87  Resp 14  Ht 6\' 2"  (1.88 m)  Wt 156 lb (70.761 kg)  BMI 20.02 kg/m2  SpO2 100%  Opioid Risk Score: 11 Fall Risk Score: Moderate Fall Risk (6-13 points) (pt educated on fall risk, brochure given to pt)   Review of Systems  Neurological: Positive for weakness and numbness.       Tingling  All other systems reviewed and are negative.       Objective:   Physical Exam General: Alert and oriented x 3, No apparent distress  HEENT: Head is normocephalic, atraumatic, PERRLA, EOMI, sclera anicteric, oral mucosa pink and moist, dentition intact, ext ear canals clear,  Neck: Supple without JVD or lymphadenopathy  Heart: Reg rate and rhythm. No murmurs rubs or gallops  Chest: CTA bilaterally without wheezes, rales, or rhonchi; no distress  Abdomen: Soft, non-tender, non-distended, bowel sounds positive.  Extremities: No clubbing, cyanosis, or edema. Pulses are 2+  Skin: Clean and intact without signs of breakdown  Neuro: pt with persistent. expressive  language deficits and processing delays. Some attentional deficits were seen as well. Behavior was normal. Strength was fairly functional with reasonable fine motor skills as well. No balance issues were noted.  Musculoskeletal: Full ROM, No pain with AROM or PROM in the neck, trunk, or extremities. Posture appropriate. There was no tenderness, swelling, or deformities at the left wrist upon provocative examination today.  Psych: Pt's affect is appropriate. Pt is cooperative. He sometimes appears depressed to me.   Assessment & Plan:   1. 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  3. 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  Clontinue klonopin to 1mg  qhs- refilled today. This has helped. 2. Continue trazodone 300mg  qhs.   3. Continue with melatonin for sleep as well.  4. Discussed community re-integration. Would like to see him volunteering at the [facility] or even [facility].  I would also like to see him increase his exercise routine and get out to the [facility] on a weekly basis.  He needs to develop a regular routine of exercise and activity as well as develop vocational and society/spiritual related goals as well. 5. Adderall refill was not needed today.   6. Follow up with me in 4 months. 15 minutes of face to face patient care time were spent during this visit. All questions were encouraged and answered.

## 2014-01-14 ENCOUNTER — Other Ambulatory Visit: Payer: Self-pay | Admitting: Physical Medicine & Rehabilitation

## 2014-01-15 ENCOUNTER — Telehealth: Payer: Self-pay

## 2014-01-15 NOTE — Telephone Encounter (Signed)
Patient called requesting a refill on Clonazepam. Patient was given a printed RX on 12/12/13 with 3 add'l refills. I called the pharmacy to see if they had the RX. The pharmacy does not have the RX. I contacted the patient again to inform him that he was given a printed RX at his OV. Patient does not recall and was not at home to look for it at the moment. He will contact the clinic back to let us know if he located the RX.

## 2014-02-23 ENCOUNTER — Telehealth: Payer: Self-pay

## 2014-02-23 NOTE — Telephone Encounter (Signed)
Patient is requesting a refill on Adderall. Last filled on 09/11/2013. Please advise.

## 2014-02-23 NOTE — Telephone Encounter (Signed)
We can refill. Print the rx and i'll sign it Monday..thanks

## 2014-02-26 MED ORDER — AMPHETAMINE-DEXTROAMPHETAMINE 20 MG PO TABS: 30.0000 mg | ORAL_TABLET | Freq: Every day | ORAL | Status: DC

## 2014-02-26 NOTE — Telephone Encounter (Signed)
Left message advising patient his adderall is ready for pick up.

## 2014-02-26 NOTE — Telephone Encounter (Signed)
Adderall RX printed to be signed.  Will contact patient when ready.

## 2014-04-09 ENCOUNTER — Encounter: Payer: Self-pay | Admitting: Physical Medicine & Rehabilitation

## 2014-04-09 ENCOUNTER — Encounter: Payer: Medicaid Other | Attending: Physical Medicine & Rehabilitation | Admitting: Physical Medicine & Rehabilitation

## 2014-04-09 VITALS — BP 127/72 | HR 82 | Resp 14 | Wt 154.0 lb

## 2014-04-09 DIAGNOSIS — G4700 Insomnia, unspecified: Secondary | ICD-10-CM | POA: Insufficient documentation

## 2014-04-09 DIAGNOSIS — S069X9S Unspecified intracranial injury with loss of consciousness of unspecified duration, sequela: Secondary | ICD-10-CM | POA: Insufficient documentation

## 2014-04-09 DIAGNOSIS — E230 Hypopituitarism: Secondary | ICD-10-CM | POA: Insufficient documentation

## 2014-04-09 DIAGNOSIS — R41840 Attention and concentration deficit: Secondary | ICD-10-CM | POA: Insufficient documentation

## 2014-04-09 DIAGNOSIS — F09 Unspecified mental disorder due to known physiological condition: Secondary | ICD-10-CM

## 2014-04-09 DIAGNOSIS — F068 Other specified mental disorders due to known physiological condition: Secondary | ICD-10-CM

## 2014-04-09 DIAGNOSIS — X58XXXS Exposure to other specified factors, sequela: Secondary | ICD-10-CM | POA: Insufficient documentation

## 2014-04-09 DIAGNOSIS — F411 Generalized anxiety disorder: Secondary | ICD-10-CM | POA: Insufficient documentation

## 2014-04-09 DIAGNOSIS — S069X0S Unspecified intracranial injury without loss of consciousness, sequela: Secondary | ICD-10-CM

## 2014-04-09 DIAGNOSIS — S069XAS Unspecified intracranial injury with loss of consciousness status unknown, sequela: Secondary | ICD-10-CM | POA: Insufficient documentation

## 2014-04-09 MED ORDER — AMPHETAMINE-DEXTROAMPHETAMINE 20 MG PO TABS: 30.0000 mg | ORAL_TABLET | Freq: Every day | ORAL | Status: DC

## 2014-04-09 NOTE — Progress Notes (Signed)
Subjective:    Patient ID: Frank Frye, male    DOB: [birth year], 28 y.o.   MRN: [id number]  HPI  Frank Frye is back regarding his TBI. He continues to work with [employer]. 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 He may start there in a couple weeks.   He has taken a break from the [place] program over the summer.   He reports some problems going to sleep after a meeting with friends on Friday night. The meeting ended at about 8:30 and he laid down around 10pm but found it difficult to fall asleep.   He doesn't always take his adderall because sometimes it's too late in the morning to use it , and he's afraid to be up too late as a result.   [name] and his mom expressed some concerns over weight loss. He actually has gained weight since the end of last year. He has lost weight from a couple years ago however. His appetite has been good and he's staying fairly active.        Pain Inventory Average Pain 0 Pain Right Now 0 My pain is no pain  In the last 24 hours, has pain interfered with the following? General activity 0 Relation with others 0 Enjoyment of life 0 What TIME of day is your pain at its worst? no pain Sleep (in general) no pain  Pain is worse with: no pain Pain improves with: no pain Relief from Meds: no pain  Mobility walk without assistance how many minutes can you walk? 120 ability to climb steps?  yes do you drive?  yes  Function not employed: date last employed 2009 disabled: date disabled 2009  Neuro/Psych confusion  Prior Studies Any changes since last visit?  no  Physicians involved in your care Any changes since last visit?  no   History reviewed. No pertinent family history. History   Social History  . Marital Status: Single    Spouse Name: N/A    Number of Children: N/A  . Years of Education: N/A   Social History Main Topics  . Smoking status: Never Smoker   . Smokeless tobacco: Never Used  . Alcohol Use: Yes  .  Drug Use: None  . Sexual Activity: None   Other Topics Concern  . None   Social History Narrative  . None   Past Surgical History  Procedure Laterality Date  . Femur fracture surgery    . Nose surgery    . Wisdom tooth extraction     Past Medical History  Diagnosis Date  . Traumatic brain injury     2009   BP 127/72  Pulse 82  Resp 14  Wt 154 lb (69.854 kg)  SpO2 99%  Opioid Risk Score:   Fall Risk Score: Moderate Fall Risk (6-13 points) (educated and handout given at previous visit for fall prevention in the home)  Review of Systems  Psychiatric/Behavioral: Positive for confusion.  All other systems reviewed and are negative.      Objective:   Physical Exam  General: Alert and oriented x 3, No apparent distress  HEENT: Head is normocephalic, atraumatic, PERRLA, EOMI, sclera anicteric, oral mucosa pink and moist, dentition intact, ext ear canals clear,  Neck: Supple without JVD or lymphadenopathy  Heart: Reg rate and rhythm. No murmurs rubs or gallops  Chest: CTA bilaterally without wheezes, rales, or rhonchi; no distress  Abdomen: Soft, non-tender, non-distended, bowel sounds positive.  Extremities: No clubbing, cyanosis,  or edema. Pulses are 2+  Skin: Clean and intact without signs of breakdown  Neuro: pt with persistent expressive language deficits and processing delays. Some attentional deficits were seen as well. Behavior was normal. Strength was fairly functional with reasonable fine motor skills as well. No balance issues were noted.  Musculoskeletal: Full ROM, No pain with AROM or PROM in the neck, trunk, or extremities. Posture appropriate. There was no tenderness, swelling, or deformities at the left wrist upon provocative examination today.  Psych: Pt's affect is appropriate. Pt is cooperative. He sometimes appears depressed to me.   Assessment & Plan:   1. 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 Insomnia related to above,  anxiety component also.  3. 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 Clontinue klonopin to 1mg  qhs- refilled today.  2. Continue trazodone 300mg  qhs.  3. Continue with melatonin for sleep as well.  4. Discussed community re-integration. I am pleased with his job efforts. This will be good from an independence and emotional standpoint as well.  5. Adderall refill was given today and for next months.  6. Follow up with me in 6 months. 15 minutes of face to face patient care time were spent during this visit. All questions were encouraged and answered.

## 2014-04-09 NOTE — Patient Instructions (Signed)
TRY TO BE AS ROUTINE AS YOU CAN WITH YOUR SLEEP SCHEDULE

## 2014-05-07 ENCOUNTER — Telehealth: Payer: Self-pay

## 2014-05-07 ENCOUNTER — Other Ambulatory Visit: Payer: Self-pay

## 2014-05-07 DIAGNOSIS — G4700 Insomnia, unspecified: Secondary | ICD-10-CM

## 2014-05-07 DIAGNOSIS — F0781 Postconcussional syndrome: Secondary | ICD-10-CM

## 2014-05-07 DIAGNOSIS — S069X0S Unspecified intracranial injury without loss of consciousness, sequela: Principal | ICD-10-CM

## 2014-05-07 DIAGNOSIS — R4189 Other symptoms and signs involving cognitive functions and awareness: Secondary | ICD-10-CM

## 2014-05-07 DIAGNOSIS — F068 Other specified mental disorders due to known physiological condition: Secondary | ICD-10-CM

## 2014-05-07 MED ORDER — CLONAZEPAM 1 MG PO TABS: 1.0000 mg | ORAL_TABLET | Freq: Every day | ORAL | Status: DC

## 2014-05-07 NOTE — Telephone Encounter (Signed)
Clonazepam called in at pharmacy.

## 2014-05-07 NOTE — Telephone Encounter (Signed)
Clonazepam refill called in at pharmacy. Attempted to contact patient. Left a voicemail informing patient that the RX he requested had been called in at the pharmacy.

## 2014-05-07 NOTE — Telephone Encounter (Signed)
Patient is requesting a refill on Clonazepam. Patient will be going out of town and would like a refill as soon as possible.

## 2014-08-17 ENCOUNTER — Other Ambulatory Visit: Payer: Self-pay

## 2014-08-20 ENCOUNTER — Other Ambulatory Visit: Payer: Self-pay | Admitting: Physical Medicine & Rehabilitation

## 2014-09-07 ENCOUNTER — Other Ambulatory Visit: Payer: Self-pay | Admitting: Physical Medicine & Rehabilitation

## 2014-10-09 ENCOUNTER — Encounter: Payer: Medicaid Other | Admitting: Physical Medicine & Rehabilitation

## 2014-11-12 ENCOUNTER — Encounter: Payer: Self-pay | Admitting: Physical Medicine & Rehabilitation

## 2014-11-12 ENCOUNTER — Encounter: Payer: Medicaid Other | Attending: Physical Medicine & Rehabilitation | Admitting: Physical Medicine & Rehabilitation

## 2014-11-12 VITALS — BP 112/62 | HR 70 | Resp 14

## 2014-11-12 DIAGNOSIS — G4700 Insomnia, unspecified: Secondary | ICD-10-CM

## 2014-11-12 DIAGNOSIS — F068 Other specified mental disorders due to known physiological condition: Secondary | ICD-10-CM | POA: Insufficient documentation

## 2014-11-12 DIAGNOSIS — F0781 Postconcussional syndrome: Secondary | ICD-10-CM | POA: Diagnosis not present

## 2014-11-12 DIAGNOSIS — S069X0S Unspecified intracranial injury without loss of consciousness, sequela: Secondary | ICD-10-CM

## 2014-11-12 MED ORDER — TRAZODONE HCL 100 MG PO TABS: ORAL_TABLET | ORAL | Status: DC

## 2014-11-12 MED ORDER — SUVOREXANT 10 MG PO TABS: 10.0000 mg | ORAL_TABLET | Freq: Every day | ORAL | Status: DC

## 2014-11-12 MED ORDER — CLONAZEPAM 1 MG PO TABS: 1.0000 mg | ORAL_TABLET | Freq: Every day | ORAL | Status: DC

## 2014-11-12 MED ORDER — AMPHETAMINE-DEXTROAMPHETAMINE 20 MG PO TABS: 30.0000 mg | ORAL_TABLET | Freq: Every day | ORAL | Status: DC

## 2014-11-12 NOTE — Patient Instructions (Signed)
STOP THE MELATONIN

## 2014-11-12 NOTE — Progress Notes (Signed)
Subjective:    Patient ID: Frank Frye, male    DOB: [birth year], 29 y.o.   MRN: [id number]  HPI  Frank Frye is back regarding his TBI.  He remains involved in the church. 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 He states that his boss states he goes "too slow".  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 He states that he's looking for another job.   He had some BRB in his stool after being constipated the other day.   Sleep remains a problem.     Pain Inventory Average Pain 2 Pain Right Now 0 My pain is dull  In the last 24 hours, has pain interfered with the following? General activity 0 Relation with others 0 Enjoyment of life 0 What TIME of day is your pain at its worst? evening Sleep (in general) Poor  Pain is worse with: standing Pain improves with: rest Relief from Meds: pain is minimal  Mobility walk without assistance how many minutes can you walk? 180 ability to climb steps?  yes do you drive?  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  no  Physicians involved in your care Any changes since last visit?  no   History reviewed. No pertinent family history. History   Social History  . Marital Status: Single    Spouse Name: N/A    Number of Children: N/A  . Years of Education: N/A   Social History Main Topics  . Smoking status: Never Smoker   . Smokeless tobacco: Never Used  . Alcohol Use: Yes  . Drug Use: None  . Sexual Activity: None   Other Topics Concern  . None   Social History Narrative   Past Surgical History  Procedure Laterality Date  . Femur fracture surgery    . Nose surgery    . Wisdom tooth extraction     Past Medical History  Diagnosis Date  . Traumatic brain injury     2009   BP 112/62 mmHg  Pulse 70  Resp 14  SpO2 97%  Opioid Risk Score:   Fall  Risk Score:   Review of Systems  All other systems reviewed and are negative.      Objective:   Physical Exam  General: Alert and oriented x 3, No apparent distress  HEENT: Head is normocephalic, atraumatic, PERRLA, EOMI, sclera anicteric, oral mucosa pink and moist, dentition intact, ext ear canals clear,  Neck: Supple without JVD or lymphadenopathy  Heart: Reg rate and rhythm. No murmurs rubs or gallops  Chest: CTA bilaterally without wheezes, rales, or rhonchi; no distress  Abdomen: Soft, non-tender, non-distended, bowel sounds positive.  Extremities: No clubbing, cyanosis, or edema. Pulses are 2+  Skin: Clean and intact without signs of breakdown  Neuro: pt with persistent expressive language deficits and processing delays. Some attentional deficits were seen as well. Behavior was normal. Strength was fairly functional with reasonable fine motor skills as well. No balance issues were noted.  Musculoskeletal: Full ROM, No pain with AROM or PROM in the neck, trunk, or extremities. Posture appropriate. There was no tenderness, swelling, or deformities at the left wrist upon provocative examination today.  Psych: Pt's affect is appropriate. Pt is cooperative. He appears depressed to me. He does appear fatigued.     Assessment & Plan:  1. 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       Plan:  1. Klonopin   2. Continue trazodone 3. Stop melatonin ----begin trial of belsomra. ---pt assistance on line. 4. Discussed community re-integration. continue his efforts 5. Adderall refill was given today and for next months.  6. Follow up with me in 6 months. 15 minutes of face to face patient care time were spent during this visit. All questions were encouraged and answered.

## 2014-11-26 ENCOUNTER — Ambulatory Visit: Payer: Medicaid Other | Admitting: Physical Medicine & Rehabilitation

## 2015-01-17 ENCOUNTER — Telehealth: Payer: Self-pay | Admitting: *Deleted

## 2015-01-17 ENCOUNTER — Other Ambulatory Visit: Payer: Self-pay | Admitting: *Deleted

## 2015-01-17 NOTE — Telephone Encounter (Signed)
Frank Frye called requesting a refill on his Adderall.  I understand he has a brain injury but I am concerned about this request because by your note you gave him multiple rx for this in January and is to return to see you 02/11/15.  By your note it looke like you may have given him 3 rx but it is impossible to see how many, so I checked the [facility] and it doesn't show any rx being filled since November 30,2015.  I called [facility] where he gets all his meds and they have not filled since November.  I asked Frank Frye about these prescriptions and he says his mother keeps up with his prescriptions but says he doesn't take every day.  When questioned further he read the date on his bottle and it is 10/01/14 and he has 6 capsules. I explained to him that these are controlled medications and are easily abused so we cannot give rx for pick up and have no explanation for what happened to the 3 rx he was given in January. I tried to reach his mother but there was no answer. What do you advise we do?

## 2015-01-18 NOTE — Telephone Encounter (Signed)
i agree with you. For now, i would be patient and ask his mother to call us to see if she can help clear things up.

## 2015-02-11 ENCOUNTER — Ambulatory Visit: Payer: Medicaid Other | Admitting: Physical Medicine & Rehabilitation

## 2015-03-18 ENCOUNTER — Encounter: Payer: Self-pay | Admitting: Physical Medicine & Rehabilitation

## 2015-03-18 ENCOUNTER — Encounter: Payer: Medicaid Other | Attending: Physical Medicine & Rehabilitation | Admitting: Physical Medicine & Rehabilitation

## 2015-03-18 ENCOUNTER — Telehealth: Payer: Self-pay | Admitting: *Deleted

## 2015-03-18 ENCOUNTER — Other Ambulatory Visit: Payer: Self-pay | Admitting: Physical Medicine & Rehabilitation

## 2015-03-18 VITALS — BP 134/79 | HR 76 | Resp 14

## 2015-03-18 DIAGNOSIS — F068 Other specified mental disorders due to known physiological condition: Secondary | ICD-10-CM | POA: Diagnosis not present

## 2015-03-18 DIAGNOSIS — G4700 Insomnia, unspecified: Secondary | ICD-10-CM | POA: Diagnosis not present

## 2015-03-18 DIAGNOSIS — G894 Chronic pain syndrome: Secondary | ICD-10-CM | POA: Diagnosis not present

## 2015-03-18 DIAGNOSIS — S069X0S Unspecified intracranial injury without loss of consciousness, sequela: Secondary | ICD-10-CM

## 2015-03-18 DIAGNOSIS — Z79899 Other long term (current) drug therapy: Secondary | ICD-10-CM | POA: Insufficient documentation

## 2015-03-18 DIAGNOSIS — F0781 Postconcussional syndrome: Secondary | ICD-10-CM

## 2015-03-18 DIAGNOSIS — Z5181 Encounter for therapeutic drug level monitoring: Secondary | ICD-10-CM | POA: Insufficient documentation

## 2015-03-18 MED ORDER — AMPHETAMINE-DEXTROAMPHETAMINE 20 MG PO TABS: 30.0000 mg | ORAL_TABLET | Freq: Every day | ORAL | Status: DC

## 2015-03-18 MED ORDER — SUVOREXANT 10 MG PO TABS
10.0000 mg | ORAL_TABLET | Freq: Every day | ORAL | Status: DC
Start: 2015-03-18 — End: 2015-06-09

## 2015-03-18 NOTE — Progress Notes (Signed)
Subjective:    Patient ID: Frank Frye, male    DOB: 1986/05/05, 29 y.o.   MRN: [id number]  HPI   Frank Frye is here in follow up of his TBI. He briefly tried belsomra (10 day supply) which helped initially, however it may have caused nightmares toward the end. Mom states that he doesn't sleep as well the days he uses the 30mg  dose of adderall although that effect is not consistent. He has found some a "rain" app which helped initially as a form of background noise---it  No longer works.   Frank Frye remains on his klonopin as well as his trazodone 200-300mg  qhs. He uses the higher dose when he feels more aroused at night.   He remains somewhat involved with is church.    Pain Inventory Average Pain 0 Pain Right Now 0 My pain is no pain  In the last 24 hours, has pain interfered with the following? General activity 0 Relation with others 0 Enjoyment of life 0 What TIME of day is your pain at its worst? no pain Sleep (in general) Fair  Pain is worse with: no pain Pain improves with: no pain Relief from Meds: no pain  Mobility walk without assistance  Function disabled: date disabled .  Neuro/Psych No problems in this area  Prior Studies Any changes since last visit?  no  Physicians involved in your care Any changes since last visit?  no   No family history on file. History   Social History  . Marital Status: Single    Spouse Name: N/A  . Number of Children: N/A  . Years of Education: N/A   Social History Main Topics  . Smoking status: Never Smoker   . Smokeless tobacco: Never Used  . Alcohol Use: Yes  . Drug Use: Not on file  . Sexual Activity: Not on file   Other Topics Concern  . None   Social History Narrative   Past Surgical History  Procedure Laterality Date  . Femur fracture surgery    . Nose surgery    . Wisdom tooth extraction     Past Medical History  Diagnosis Date  . Traumatic brain injury     2009   BP 134/79 mmHg  Pulse 76  Resp  14  SpO2 99%  Opioid Risk Score:   Fall Risk Score:  `1  Depression screen PHQ 2/9  No flowsheet data found.   Review of Systems  All other systems reviewed and are negative.      Objective:   Physical Exam   General: Alert and oriented x 3, No apparent distress  HEENT: Head is normocephalic, atraumatic, PERRLA, EOMI, sclera anicteric, oral mucosa pink and moist, dentition intact, ext ear canals clear,  Neck: Supple without JVD or lymphadenopathy  Heart: Reg rate and rhythm. No murmurs rubs or gallops  Chest: CTA bilaterally without wheezes, rales, or rhonchi; no distress  Abdomen: Soft, non-tender, non-distended, bowel sounds positive.  Extremities: No clubbing, cyanosis, or edema. Pulses are 2+  Skin: Clean and intact without signs of breakdown  Neuro: pt with persistent expressive language deficits and processing delays. Some attentional deficits were seen as well.--he becomes easily distracted in mid sentence and needs extra time to finish a though.  Behavior was normal. Strength was fairly functional with reasonable fine motor skills as well. No balance issues were noted.  Musculoskeletal: Full ROM, No pain with AROM or PROM in the neck, trunk, or extremities. Posture appropriate. There was no tenderness,  swelling, or deformities at the left wrist upon provocative examination today.  Psych: Pt's affect is appropriate. Pt is cooperative.   He does appear fatigued.    Assessment & Plan:   1. 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    Plan:  1. Klonopin for now at same dose 2. Continue trazodone  3. Belsomra re-trial----10mg  qhs---working on override paperwork with [facility]   4. Spent extensive time discussing sleep hygiene. He needs to find ways to settle himself down at night time---ways which are different/variable from day to day. We discussed reading, puzzles, music, white noise, among other  things. Consider referral to sleep specialist also.  5. Adderall refill was given today at reduced, 20mg  dose to see if this effects HS sleep patterns .  6. Follow up with me in 6 months. 15 minutes of face to face patient care time were spent during this visit. All questions were encouraged and answered.

## 2015-03-18 NOTE — Patient Instructions (Signed)
WORK ON IMPROVING YOUR SLEEP HYGIENE.  FIND MORE WAYS OF EXTERNAL RELAXATION TO HELP YOU  SLEEP AT NIGHT!!!

## 2015-03-18 NOTE — Telephone Encounter (Signed)
Prior Berkley Harveyauth is being initiated for the Belsomra with [employer].  He has tried and failed Ambien and Halcion per [employer].

## 2015-03-19 LAB — PMP ALCOHOL METABOLITE (ETG): Ethyl Glucuronide (EtG): NEGATIVE ng/mL

## 2015-03-22 LAB — BENZODIAZEPINES (GC/LC/MS), URINE
ALPRAZOLAMU: NEGATIVE ng/mL (ref <25)
Clonazepam metabolite (GC/LC/MS), ur confirm: 235 ng/mL (ref <25)
Flurazepam metabolite (GC/LC/MS), ur confirm: NEGATIVE ng/mL (ref <50)
Lorazepam (GC/LC/MS), ur confirm: NEGATIVE ng/mL (ref <50)
MIDAZOLAMU: NEGATIVE ng/mL (ref <50)
Nordiazepam (GC/LC/MS), ur confirm: NEGATIVE ng/mL (ref <50)
OXAZEPAMU: NEGATIVE ng/mL (ref <50)
TEMAZEPAMU: NEGATIVE ng/mL (ref <50)
Triazolam metabolite (GC/LC/MS), ur confirm: NEGATIVE ng/mL (ref <50)

## 2015-03-22 LAB — AMPHETAMINES (GC/LC/MS), URINE
Amphetamine GC/MS Conf: 12458 ng/mL (ref <250)
Methamphetamine Quant, Ur: NEGATIVE ng/mL (ref <250)

## 2015-03-23 LAB — PRESCRIPTION MONITORING PROFILE (SOLSTAS)
BARBITURATE SCREEN, URINE: NEGATIVE ng/mL
Buprenorphine, Urine: NEGATIVE ng/mL
CANNABINOID SCRN UR: NEGATIVE ng/mL
Carisoprodol, Urine: NEGATIVE ng/mL
Cocaine Metabolites: NEGATIVE ng/mL
Creatinine, Urine: 156.29 mg/dL (ref >20.0)
Fentanyl, Ur: NEGATIVE ng/mL
MDMA URINE: NEGATIVE ng/mL
METHADONE SCREEN, URINE: NEGATIVE ng/mL
Meperidine, Ur: NEGATIVE ng/mL
Nitrites, Initial: NEGATIVE ug/mL
Opiate Screen, Urine: NEGATIVE ng/mL
Oxycodone Screen, Ur: NEGATIVE ng/mL
Propoxyphene: NEGATIVE ng/mL
Tapentadol, urine: NEGATIVE ng/mL
Tramadol Scrn, Ur: NEGATIVE ng/mL
ZOLPIDEM, URINE: NEGATIVE ng/mL
pH, Initial: 5.4 pH (ref 4.5–8.9)

## 2015-03-25 NOTE — Telephone Encounter (Signed)
Pharmacy says Belsomra still needs PA so a second attempt made to initiate with [employer] this time via Cover My Meds.

## 2015-04-03 NOTE — Progress Notes (Signed)
Urine drug screen for this encounter is consistent for prescribed medication 

## 2015-04-09 ENCOUNTER — Other Ambulatory Visit: Payer: Self-pay | Admitting: *Deleted

## 2015-04-09 NOTE — Telephone Encounter (Signed)
Called Medicaid and spoke to Rob about patient's  Belsomra prior authorization request.  This request was approved on 03/29/15 PA# [id number] good from 03/29/15 thu 09/25/15.  I also called the pharmacy to let them know that is was approved and is good thru November.

## 2015-04-12 ENCOUNTER — Other Ambulatory Visit: Payer: Self-pay | Admitting: *Deleted

## 2015-04-12 DIAGNOSIS — R4189 Other symptoms and signs involving cognitive functions and awareness: Secondary | ICD-10-CM

## 2015-04-12 DIAGNOSIS — F068 Other specified mental disorders due to known physiological condition: Secondary | ICD-10-CM

## 2015-04-12 DIAGNOSIS — F0781 Postconcussional syndrome: Secondary | ICD-10-CM

## 2015-04-12 DIAGNOSIS — S069X0S Unspecified intracranial injury without loss of consciousness, sequela: Principal | ICD-10-CM

## 2015-04-12 DIAGNOSIS — G4700 Insomnia, unspecified: Secondary | ICD-10-CM

## 2015-04-12 MED ORDER — CLONAZEPAM 1 MG PO TABS: 1.0000 mg | ORAL_TABLET | Freq: Every day | ORAL | Status: DC

## 2015-05-20 ENCOUNTER — Encounter: Payer: Self-pay | Admitting: Physical Medicine & Rehabilitation

## 2015-05-20 ENCOUNTER — Encounter: Payer: Medicaid Other | Attending: Physical Medicine & Rehabilitation | Admitting: Physical Medicine & Rehabilitation

## 2015-05-20 VITALS — BP 108/64 | HR 60 | Resp 14

## 2015-05-20 DIAGNOSIS — G4700 Insomnia, unspecified: Secondary | ICD-10-CM | POA: Diagnosis not present

## 2015-05-20 DIAGNOSIS — Z79899 Other long term (current) drug therapy: Secondary | ICD-10-CM | POA: Insufficient documentation

## 2015-05-20 DIAGNOSIS — Z5181 Encounter for therapeutic drug level monitoring: Secondary | ICD-10-CM | POA: Diagnosis not present

## 2015-05-20 DIAGNOSIS — G894 Chronic pain syndrome: Secondary | ICD-10-CM | POA: Diagnosis not present

## 2015-05-20 DIAGNOSIS — F0781 Postconcussional syndrome: Secondary | ICD-10-CM | POA: Insufficient documentation

## 2015-05-20 DIAGNOSIS — F068 Other specified mental disorders due to known physiological condition: Secondary | ICD-10-CM | POA: Diagnosis not present

## 2015-05-20 DIAGNOSIS — S069X0S Unspecified intracranial injury without loss of consciousness, sequela: Secondary | ICD-10-CM

## 2015-05-20 MED ORDER — AMPHETAMINE-DEXTROAMPHETAMINE 20 MG PO TABS: 20.0000 mg | ORAL_TABLET | Freq: Every day | ORAL | Status: DC

## 2015-05-20 NOTE — Progress Notes (Signed)
Subjective:    Patient ID: CYPRUS KUANG, male    DOB: [birth year], 29 y.o.   MRN: [id number]  HPI   [name] is back regarding his TBI and associated sleep disorder. We resumed the belsomra after last visit. He slept extremely well the first few days but after that "he did worse".  I looked at his sleep chart. He takes his trazodone and belsomra as well as klonpin typically around 9:45 to 10:00 pm. He falls asleep around 11:30 to 12am on most nights. He awakens at 0900 on most mornings. His mother feels that his sleep patterns are much improved as a whole.   He tends to read the "[employer]" before he goes to bed. His pre-bed pattern doesn't vary very much. He usually will turn off lights and tv before he tries to go to sleep.  His mood has been fairly steady. 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 He is enrolled with [employer] and will be looking at opportunities with them in the fall.       Pain Inventory Average Pain 0 Pain Right Now 0 My pain is no pain  In the last 24 hours, has pain interfered with the following? General activity 0 Relation with others 0 Enjoyment of life 0 What TIME of day is your pain at its worst? no pain Sleep (in general) varies  Pain is worse with: no pain Pain improves with: no pain Relief from Meds: no pain  Mobility walk without assistance  Function disabled: date disabled .  Neuro/Psych No problems in this area  Prior Studies Any changes since last visit?  no  Physicians involved in your care Any changes since last visit?  no   History reviewed. No pertinent family history. History   Social History  . Marital Status: Single    Spouse Name: N/A  . Number of Children: N/A  . Years of Education: N/A   Social History Main Topics  . Smoking status: Never Smoker   . Smokeless tobacco: Never Used  . Alcohol Use: Yes  . Drug Use: Not on file  . Sexual Activity: Not on file   Other Topics Concern  .  None   Social History Narrative   Past Surgical History  Procedure Laterality Date  . Femur fracture surgery    . Nose surgery    . Wisdom tooth extraction     Past Medical History  Diagnosis Date  . Traumatic brain injury     2009   BP 108/64 mmHg  Pulse 60  Resp 14  SpO2 98%  Opioid Risk Score:   Fall Risk Score:  `1  Depression screen PHQ 2/9  No flowsheet data found.   Review of Systems  All other systems reviewed and are negative.      Objective:   Physical Exam  General: Alert and oriented x 3, No apparent distress  HEENT: Head is normocephalic, atraumatic, PERRLA, EOMI, sclera anicteric, oral mucosa pink and moist, dentition intact, ext ear canals clear,  Neck: Supple without JVD or lymphadenopathy  Heart: Reg rate and rhythm. No murmurs rubs or gallops  Chest: CTA bilaterally without wheezes, rales, or rhonchi; no distress  Abdomen: Soft, non-tender, non-distended, bowel sounds positive.  Extremities: No clubbing, cyanosis, or edema. Pulses are 2+  Skin: Clean and intact without signs of breakdown  Neuro: pt with persistent expressive language deficits and processing delays. Some attentional deficits were seen as well.--he becomes easily distracted  in mid sentence and needs extra time to finish a though. Behavior was normal. Strength was fairly functional with reasonable fine motor skills as well. No balance issues were noted.  Musculoskeletal: Full ROM, No pain with AROM or PROM in the neck, trunk, or extremities. Posture appropriate. There was no tenderness, swelling, or deformities at the left wrist upon provocative examination today.  Psych: Pt's affect is appropriate. Pt is cooperative. He's more alert but has trouble gathering thoughts and organizing himself.   Assessment & Plan:   1. 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     Plan:  1. Klonopin for now at same dose    2. Continue trazodone  3. Belsomra 10mg  qhs---this has been more effective than anything we've tried.  May resume melatonin if sleep patterns begin to worsen again---wouldn't resume it now. 4. Spent extensive time AGAIN discussing sleep hygiene. He needs to find ways to settle himself down at night time---ways which are different/variable from day to day.   5. Adderall   20mg  daily prn for days when he's more active. Could consider shorter acting methylphenidate on days he wakes up later. 6. Follow up with me in 4 months. 15 minutes of face to face patient care time were spent during this visit. All questions were encouraged and answered.

## 2015-05-20 NOTE — Patient Instructions (Signed)
CONSIDER ADDING BACK THE MELATONIN IF YOUR SLEEP BEGINS TO WORSEN.     WORK ON RAYS

## 2015-06-09 ENCOUNTER — Other Ambulatory Visit: Payer: Self-pay | Admitting: Physical Medicine & Rehabilitation

## 2015-07-09 ENCOUNTER — Other Ambulatory Visit: Payer: Self-pay | Admitting: Physical Medicine & Rehabilitation

## 2015-08-13 ENCOUNTER — Other Ambulatory Visit: Payer: Self-pay | Admitting: Physical Medicine & Rehabilitation

## 2015-08-13 NOTE — Telephone Encounter (Signed)
Patient called requesting a refill on Klonopin

## 2015-09-11 ENCOUNTER — Other Ambulatory Visit: Payer: Self-pay | Admitting: *Deleted

## 2015-09-11 MED ORDER — CLONAZEPAM 1 MG PO TABS: 1.0000 mg | ORAL_TABLET | Freq: Every day | ORAL | Status: DC

## 2015-09-11 NOTE — Telephone Encounter (Signed)
Asking for refill on clonazepam, phoned in Rx, called pt to inform, left message on VM

## 2015-09-16 ENCOUNTER — Encounter: Payer: Self-pay | Admitting: Physical Medicine & Rehabilitation

## 2015-09-16 ENCOUNTER — Encounter: Payer: Medicaid Other | Attending: Physical Medicine & Rehabilitation | Admitting: Physical Medicine & Rehabilitation

## 2015-09-16 VITALS — BP 124/63 | HR 62

## 2015-09-16 DIAGNOSIS — F0781 Postconcussional syndrome: Secondary | ICD-10-CM | POA: Diagnosis not present

## 2015-09-16 DIAGNOSIS — X58XXXS Exposure to other specified factors, sequela: Secondary | ICD-10-CM | POA: Diagnosis not present

## 2015-09-16 DIAGNOSIS — G4700 Insomnia, unspecified: Secondary | ICD-10-CM | POA: Insufficient documentation

## 2015-09-16 DIAGNOSIS — S069X0S Unspecified intracranial injury without loss of consciousness, sequela: Secondary | ICD-10-CM | POA: Diagnosis not present

## 2015-09-16 DIAGNOSIS — Z9889 Other specified postprocedural states: Secondary | ICD-10-CM | POA: Diagnosis not present

## 2015-09-16 DIAGNOSIS — F068 Other specified mental disorders due to known physiological condition: Secondary | ICD-10-CM | POA: Diagnosis not present

## 2015-09-16 DIAGNOSIS — F419 Anxiety disorder, unspecified: Secondary | ICD-10-CM | POA: Insufficient documentation

## 2015-09-16 MED ORDER — CLONAZEPAM 1 MG PO TABS: 1.0000 mg | ORAL_TABLET | Freq: Every day | ORAL | Status: DC

## 2015-09-16 MED ORDER — SUVOREXANT 10 MG PO TABS: 1.0000 | ORAL_TABLET | Freq: Every day | ORAL | Status: DC

## 2015-09-16 MED ORDER — TRAZODONE HCL 100 MG PO TABS: 300.0000 mg | ORAL_TABLET | Freq: Every day | ORAL | Status: DC

## 2015-09-16 MED ORDER — METHYLPHENIDATE HCL 10 MG PO TABS: 10.0000 mg | ORAL_TABLET | Freq: Two times a day (BID) | ORAL | Status: DC

## 2015-09-16 NOTE — Progress Notes (Signed)
Subjective:    Patient ID: Frank Frye, male    DOB: [birth year], 29 y.o.   MRN: [id number]  HPI  Frank Frye is here in follow up of his TBI.  His mom got a job in [place] and Frank Frye is living at times alone in [place] when she commutes. He has remained steady from a cognitive standpoint.   As far as sleep goes he is doing a little better. The belsomra has worked fairly well although he has some nights where doesn't do as well. He has more of a regimen about going to sleep now. He is using the klonopin along with trazodone as well.   Frank Frye is not using the adderall often as he's afraid to take it if he wakes up after 0800. He does work on jobs and odds and ends around the house. He continues to struggle with concentration and memoryu however.   He and mom are looking into vocational rehab options.    Pain Inventory Average Pain 0 Pain Right Now 0 My pain is n/a  In the last 24 hours, has pain interfered with the following? General activity 0 Relation with others 0 Enjoyment of life 0 What TIME of day is your pain at its worst? evening Sleep (in general) Poor  Pain is worse with: standing Pain improves with: rest Relief from Meds: takes advil and it will help  Mobility walk without assistance do you drive?  yes Do you have any goals in this area?  yes  Function not employed: date last employed .  Neuro/Psych confusion  Prior Studies Any changes since last visit?  no  Physicians involved in your care Any changes since last visit?  no   History reviewed. No pertinent family history. Social History   Social History  . Marital Status: Single    Spouse Name: N/A  . Number of Children: N/A  . Years of Education: N/A   Social History Main Topics  . Smoking status: Never Smoker   . Smokeless tobacco: Never Used  . Alcohol Use: Yes  . Drug Use: None  . Sexual Activity: Not Asked   Other Topics Concern  . None   Social History Narrative   Past Surgical History    Procedure Laterality Date  . Femur fracture surgery    . Nose surgery    . Wisdom tooth extraction     Past Medical History  Diagnosis Date  . Traumatic brain injury (HCC)     2009   BP 124/63 mmHg  Pulse 62  SpO2 98%  Opioid Risk Score:   Fall Risk Score:  `1  Depression screen PHQ 2/9  No flowsheet data found.    Review of Systems  All other systems reviewed and are negative.      Objective:   Physical Exam  General: Alert and oriented x 3, No apparent distress  HEENT: Head is normocephalic, atraumatic, PERRLA, EOMI, sclera anicteric, oral mucosa pink and moist, dentition intact, ext ear canals clear,  Neck: Supple without JVD or lymphadenopathy  Heart: Reg rate and rhythm. No murmurs rubs or gallops  Chest: CTA bilaterally without wheezes, rales, or rhonchi; no distress  Abdomen: Soft, non-tender, non-distended, bowel sounds positive.  Extremities: No clubbing, cyanosis, or edema. Pulses are 2+  Skin: Clean and intact without signs of breakdown  Neuro: pt with persistent expressive language deficits and processing delays. He continues to struggle with memory and attention. Behavior was normal. Strength was fairly functional with reasonable fine motor  skills as well. No balance issues were noted.  Musculoskeletal: Full ROM, No pain with AROM or PROM in the neck, trunk, or extremities. Posture appropriate. There was no tenderness, swelling, or deformities at the left wrist upon provocative examination today.  Psych: Pt's affect is appropriate. Pt is cooperative and pleasant as always.  Assessment & Plan:   1. 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    Plan:  1. Klonopin for now at same dose  2. Continue trazodone  3. Belsomra 10mg  qhs has been effective.   4. Continue with efforts in regard to sleep hygiene and a daily routine. 5. Will replace adderall with ritalin so that he more  often uses it during the day. I believe he really needs a stimulant to assist with concentration, memory, attention. Rx'ed 10mg  BID. A second rx was given. 6. Follow up with me in 4 months. 15 minutes of face to face patient care time were spent during this visit. All questions were encouraged and answered.

## 2015-09-16 NOTE — Patient Instructions (Signed)
PLEASE CALL ME WITH ANY PROBLEMS OR QUESTIONS [phone]).  HAVE A GOOD DAY!  HAPPY THANKSGIVING!   YOU MAY TAKE YOUR RITALIN AS LATE AS 12 NOON TO 1PM

## 2015-10-07 ENCOUNTER — Other Ambulatory Visit: Payer: Self-pay | Admitting: *Deleted

## 2015-10-07 DIAGNOSIS — F068 Other specified mental disorders due to known physiological condition: Secondary | ICD-10-CM

## 2015-10-07 DIAGNOSIS — S069X0S Unspecified intracranial injury without loss of consciousness, sequela: Principal | ICD-10-CM

## 2015-10-07 DIAGNOSIS — F0781 Postconcussional syndrome: Secondary | ICD-10-CM

## 2015-10-07 MED ORDER — CLONAZEPAM 1 MG PO TABS: 1.0000 mg | ORAL_TABLET | Freq: Every day | ORAL | Status: DC

## 2015-10-07 MED ORDER — SUVOREXANT 10 MG PO TABS: 1.0000 | ORAL_TABLET | Freq: Every day | ORAL | Status: DC

## 2015-10-11 ENCOUNTER — Telehealth: Payer: Self-pay | Admitting: *Deleted

## 2015-10-11 NOTE — Telephone Encounter (Signed)
Prior authorization intitiated with [employer] for Mr [place]speed's Belsomora.  I have notified Mr Londell MohHouston this is in progress.

## 2015-12-30 ENCOUNTER — Telehealth: Payer: Self-pay | Admitting: Physical Medicine & Rehabilitation

## 2015-12-30 DIAGNOSIS — F0781 Postconcussional syndrome: Secondary | ICD-10-CM

## 2015-12-30 DIAGNOSIS — R4189 Other symptoms and signs involving cognitive functions and awareness: Secondary | ICD-10-CM

## 2015-12-30 DIAGNOSIS — S069X0S Unspecified intracranial injury without loss of consciousness, sequela: Principal | ICD-10-CM

## 2015-12-30 DIAGNOSIS — F068 Other specified mental disorders due to known physiological condition: Secondary | ICD-10-CM

## 2015-12-30 NOTE — Telephone Encounter (Signed)
Patient needing a refill on his Ritalin.  Please call patient at [phone].

## 2015-12-31 MED ORDER — METHYLPHENIDATE HCL 10 MG PO TABS: 10.0000 mg | ORAL_TABLET | Freq: Two times a day (BID) | ORAL | Status: DC

## 2015-12-31 NOTE — Telephone Encounter (Signed)
Patients next appointment is 01/22/2016,  The last fill date for Ritalin was 09/16/2015, FYI.Marland KitchenMarland KitchenMarland KitchenMarland Kitchenplease advise

## 2015-12-31 NOTE — Telephone Encounter (Signed)
He uses it somewhat intermittently. Can have RF

## 2015-12-31 NOTE — Telephone Encounter (Signed)
Left message on personally identified VM that RX is available for pick up

## 2016-01-13 ENCOUNTER — Ambulatory Visit: Payer: Medicaid Other | Admitting: Physical Medicine & Rehabilitation

## 2016-01-22 ENCOUNTER — Encounter: Payer: Self-pay | Admitting: Physical Medicine & Rehabilitation

## 2016-01-22 ENCOUNTER — Encounter: Payer: Medicaid Other | Attending: Physical Medicine & Rehabilitation | Admitting: Physical Medicine & Rehabilitation

## 2016-01-22 VITALS — BP 124/61 | HR 74 | Resp 14

## 2016-01-22 DIAGNOSIS — G894 Chronic pain syndrome: Secondary | ICD-10-CM

## 2016-01-22 DIAGNOSIS — F0781 Postconcussional syndrome: Secondary | ICD-10-CM | POA: Diagnosis not present

## 2016-01-22 DIAGNOSIS — F068 Other specified mental disorders due to known physiological condition: Secondary | ICD-10-CM

## 2016-01-22 DIAGNOSIS — S069X0S Unspecified intracranial injury without loss of consciousness, sequela: Secondary | ICD-10-CM

## 2016-01-22 DIAGNOSIS — G4700 Insomnia, unspecified: Secondary | ICD-10-CM | POA: Insufficient documentation

## 2016-01-22 DIAGNOSIS — F419 Anxiety disorder, unspecified: Secondary | ICD-10-CM | POA: Diagnosis not present

## 2016-01-22 DIAGNOSIS — M545 Low back pain: Secondary | ICD-10-CM | POA: Diagnosis present

## 2016-01-22 DIAGNOSIS — Z09 Encounter for follow-up examination after completed treatment for conditions other than malignant neoplasm: Secondary | ICD-10-CM | POA: Diagnosis not present

## 2016-01-22 DIAGNOSIS — Z5181 Encounter for therapeutic drug level monitoring: Secondary | ICD-10-CM | POA: Diagnosis not present

## 2016-01-22 DIAGNOSIS — Z79899 Other long term (current) drug therapy: Secondary | ICD-10-CM

## 2016-01-22 DIAGNOSIS — Z87820 Personal history of traumatic brain injury: Secondary | ICD-10-CM | POA: Insufficient documentation

## 2016-01-22 MED ORDER — CLONAZEPAM 1 MG PO TABS: 1.0000 mg | ORAL_TABLET | Freq: Every day | ORAL | Status: DC

## 2016-01-22 MED ORDER — METHYLPHENIDATE HCL 10 MG PO TABS: 10.0000 mg | ORAL_TABLET | Freq: Two times a day (BID) | ORAL | Status: DC

## 2016-01-22 NOTE — Progress Notes (Signed)
Subjective:    Patient ID: Frank Frye, male    DOB: May 11, 1986, 30 y.o.   MRN: [id number]  HPI   [name] is here in follow up of his TBI and associated deficits. 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   His sleep has been pretty steady especially when he keeps to his schedule. Belsomra is at .   He maintains on his ritalin and klonopin as directed.    Pain Inventory Average Pain 2-lower back  Pain Right Now 3 My pain is intermittent, dull and aching  In the last 24 hours, has pain interfered with the following? General activity 0 Relation with others 0 Enjoyment of life 0 What TIME of day is your pain at its worst? daytime and evening  Sleep (in general) Good  Pain is worse with: walking, bending and standing Pain improves with: medication-Advil liquid gel  Relief from Meds: 9  Mobility walk without assistance ability to climb steps?  yes do you drive?  yes  Function Do you have any goals in this area?  no  Neuro/Psych No problems in this area  Prior Studies Any changes since last visit?  no  Physicians involved in your care Any changes since last visit?  no   History reviewed. No pertinent family history. Social History   Social History  . Marital Status: Single    Spouse Name: N/A  . Number of Children: N/A  . Years of Education: N/A   Social History Main Topics  . Smoking status: Never Smoker   . Smokeless tobacco: Never Used  . Alcohol Use: Yes  . Drug Use: None  . Sexual Activity: Not Asked   Other Topics Concern  . None   Social History Narrative   Past Surgical History  Procedure Laterality Date  . Femur fracture surgery    . Nose surgery    . Wisdom tooth extraction     Past Medical History  Diagnosis Date  . Traumatic brain injury (HCC)     2009   BP 124/61 mmHg  Pulse 74  Resp 14  SpO2 97%  Opioid Risk Score:   Fall Risk Score:  `1  Depression screen PHQ 2/9  No flowsheet data  found.   Review of Systems  All other systems reviewed and are negative.      Objective:   Physical Exam  General: Alert and oriented x 3, No apparent distress  HEENT: Head is normocephalic, atraumatic, PERRLA, EOMI, sclera anicteric, oral mucosa pink and moist, dentition intact, ext ear canals clear,  Neck: Supple without JVD or lymphadenopathy  Heart: Reg rate and rhythm. No murmurs rubs or gallops  Chest: CTA bilaterally without wheezes, rales, or rhonchi; no distress  Abdomen: Soft, non-tender, non-distended, bowel sounds positive.  Extremities: No clubbing, cyanosis, or edema. Pulses are 2+  Skin: Clean and intact without signs of breakdown  Neuro: pt with persistent expressive language deficits and processing delays. He continues to struggle with memory and attention. Behavior was normal. Strength was fairly functional with reasonable fine motor skills as well. No balance issues were noted.  Musculoskeletal: Full ROM, No pain with AROM or PROM in the neck, trunk, or extremities. Posture appropriate. There was no tenderness, swelling, or deformities at the left wrist upon provocative examination today.  Psych: Pt's affect is appropriate. Pt is cooperative and pleasant as always.    Assessment & Plan:   1. Late effects of TBI (2009) with  attention and processing deficits  2. 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    Plan:  1. Klonopin for now at same dose  2. Continue trazodone 3. Belsomra 10mg  qhs has been effective.  4. Continue with efforts in regard to sleep hygiene and a daily routine.  5. Ritalin 10mg  BID. A second rx was given.   6. Follow up with me in 4 months. 15 minutes of face to face patient care time were spent during this visit. All questions were encouraged and answered.

## 2016-01-22 NOTE — Patient Instructions (Addendum)
PLEASE CALL ME WITH ANY PROBLEMS OR QUESTIONS [phone]).     CALL TO PICK YOU RITALIN IN 2 MONTHS!!

## 2016-01-26 LAB — TOXASSURE SELECT,+ANTIDEPR,UR: PDF: 0

## 2016-01-27 NOTE — Progress Notes (Signed)
Urine drug screen for this encounter is consistent for prescribed medication 

## 2016-04-08 ENCOUNTER — Other Ambulatory Visit: Payer: Self-pay | Admitting: Physical Medicine & Rehabilitation

## 2016-04-09 NOTE — Telephone Encounter (Signed)
Rx's called in . 

## 2016-04-15 ENCOUNTER — Telehealth: Payer: Self-pay | Admitting: Physical Medicine & Rehabilitation

## 2016-04-15 DIAGNOSIS — F0781 Postconcussional syndrome: Secondary | ICD-10-CM

## 2016-04-15 DIAGNOSIS — S069X0S Unspecified intracranial injury without loss of consciousness, sequela: Principal | ICD-10-CM

## 2016-04-15 DIAGNOSIS — F068 Other specified mental disorders due to known physiological condition: Secondary | ICD-10-CM

## 2016-04-15 MED ORDER — METHYLPHENIDATE HCL 10 MG PO TABS: 10.0000 mg | ORAL_TABLET | Freq: Two times a day (BID) | ORAL | Status: DC

## 2016-04-15 NOTE — Telephone Encounter (Signed)
Patient needs refill on Belsomra called [facility] [place].

## 2016-04-15 NOTE — Telephone Encounter (Signed)
Please call patient and let him know he can pick up RX. Please and thank you.

## 2016-04-15 NOTE — Telephone Encounter (Signed)
Patient is needing a refill on his Ritalin.  Please call mom back at [phone].

## 2016-04-15 NOTE — Telephone Encounter (Signed)
Ok to fill? Last appt March 2017 Next appt July 2017 Please print and I will call patient. Thank you

## 2016-04-15 NOTE — Telephone Encounter (Signed)
rx printed

## 2016-04-16 NOTE — Telephone Encounter (Signed)
[facility]16[facility]

## 2016-04-16 NOTE — Telephone Encounter (Signed)
Pt's mom was advised to pick up Ritalin. Made aware that the PA for Belsomra will be started today.

## 2016-05-07 ENCOUNTER — Other Ambulatory Visit: Payer: Self-pay | Admitting: Physical Medicine & Rehabilitation

## 2016-05-18 ENCOUNTER — Encounter: Payer: Medicaid Other | Attending: Physical Medicine & Rehabilitation | Admitting: Physical Medicine & Rehabilitation

## 2016-05-18 ENCOUNTER — Encounter: Payer: Self-pay | Admitting: Physical Medicine & Rehabilitation

## 2016-05-18 VITALS — BP 107/73 | HR 79

## 2016-05-18 DIAGNOSIS — Z87820 Personal history of traumatic brain injury: Secondary | ICD-10-CM | POA: Insufficient documentation

## 2016-05-18 DIAGNOSIS — F068 Other specified mental disorders due to known physiological condition: Secondary | ICD-10-CM | POA: Diagnosis not present

## 2016-05-18 DIAGNOSIS — F0781 Postconcussional syndrome: Secondary | ICD-10-CM | POA: Diagnosis not present

## 2016-05-18 DIAGNOSIS — S069X0S Unspecified intracranial injury without loss of consciousness, sequela: Secondary | ICD-10-CM

## 2016-05-18 DIAGNOSIS — Z5189 Encounter for other specified aftercare: Secondary | ICD-10-CM | POA: Diagnosis not present

## 2016-05-18 DIAGNOSIS — F419 Anxiety disorder, unspecified: Secondary | ICD-10-CM | POA: Diagnosis not present

## 2016-05-18 DIAGNOSIS — G4700 Insomnia, unspecified: Secondary | ICD-10-CM | POA: Diagnosis not present

## 2016-05-18 MED ORDER — METHYLPHENIDATE HCL 10 MG PO TABS: 10.0000 mg | ORAL_TABLET | Freq: Two times a day (BID) | ORAL | Status: DC

## 2016-05-18 MED ORDER — SUVOREXANT 10 MG PO TABS: 1.0000 | ORAL_TABLET | Freq: Every day | ORAL | Status: DC

## 2016-05-18 NOTE — Patient Instructions (Signed)
PLEASE CALL ME WITH ANY PROBLEMS OR QUESTIONS [phone])   Headphones at night?

## 2016-05-18 NOTE — Progress Notes (Signed)
Subjective:    Patient ID: Frank Frye, male    DOB: 28-May-1986, 30 y.o.   MRN: [id number]  HPI   Frank Frye is here in follow up of his TBI. He has been involved in [employer] and they are assessing his strengths and weaknesses before they begin to pursue job possibilities. He hcontinues to sleep better with the belsomra although he still struggles falling asleep at times. He sometimes will wake up early in the morning too.   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 His parents continue to support him in that direction.   Pain Inventory Average Pain 0 Pain Right Now 0 My pain is no pain  In the last 24 hours, has pain interfered with the following? General activity no pain Relation with others no pain Enjoyment of life no pain What TIME of day is your pain at its worst? na Sleep (in general) NA  Pain is worse with: na Pain improves with: na Relief from Meds: na  Mobility walk without assistance  Function not employed: date last employed .  Neuro/Psych No problems in this area  Prior Studies Any changes since last visit?  no  Physicians involved in your care Any changes since last visit?  no   History reviewed. No pertinent family history. Social History   Social History  . Marital Status: Single    Spouse Name: N/A  . Number of Children: N/A  . Years of Education: N/A   Social History Main Topics  . Smoking status: Never Smoker   . Smokeless tobacco: Never Used  . Alcohol Use: Yes  . Drug Use: None  . Sexual Activity: Not Asked   Other Topics Concern  . None   Social History Narrative   Past Surgical History  Procedure Laterality Date  . Femur fracture surgery    . Nose surgery    . Wisdom tooth extraction     Past Medical History  Diagnosis Date  . Traumatic brain injury (HCC)     2009   BP 107/73 mmHg  Pulse 79  SpO2 95%  Opioid Risk Score:   Fall Risk Score:  `1  Depression screen PHQ 2/9  No flowsheet data found.   Review of  Systems  Constitutional: Negative.   HENT: Negative.   Eyes: Negative.   Respiratory: Negative.   Cardiovascular: Negative.   Gastrointestinal: Negative.   Endocrine: Negative.   Genitourinary: Negative.   Musculoskeletal: Negative.   Skin: Negative.   Allergic/Immunologic: Negative.   Neurological: Positive for speech difficulty.  Hematological: Negative.   Psychiatric/Behavioral: Negative.        Objective:   Physical Exam  General: Alert and oriented x 3, No apparent distress  HEENT: Head is normocephalic, atraumatic, PERRLA, EOMI, sclera anicteric, oral mucosa pink and moist, dentition intact, ext ear canals clear,  Neck: Supple without JVD or lymphadenopathy  Heart: Reg rate and rhythm. No murmurs rubs or gallops  Chest: CTA bilaterally without wheezes, rales, or rhonchi; no distress  Abdomen: Soft, non-tender, non-distended, bowel sounds positive.  Extremities: No clubbing, cyanosis, or edema. Pulses are 2+  Skin: Clean and intact without signs of breakdown  Neuro: pt with persistent expressive language deficits and processing delays. He continues to struggle with memory and attention. Behavior was normal. Strength was fairly functional with reasonable fine motor skills as well. No balance issues were noted.  Musculoskeletal: Full ROM, No pain with AROM or PROM in the neck, trunk, or extremities. Posture appropriate. There was  no tenderness, swelling, or deformities at the left wrist upon provocative examination today.  Psych: Pt's affect is appropriate. Pt is cooperative and pleasant as always.    Assessment & Plan:   1. 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    Plan:  1. Klonopin for now at same dose  2. Continue trazodone  3. Belsomra  qhs has been effective. Refilled today.  4. Continue with efforts in regard to sleep hygiene and a daily routine. I stressed the routine in detail  today! 5. Ritalin  BID. A second rx was given.  6. Follow up with me in 4 months. 15 minutes of face to face patient care time were spent during this visit. All questions were encouraged and answered.

## 2016-05-21 ENCOUNTER — Telehealth: Payer: Self-pay | Admitting: Physical Medicine & Rehabilitation

## 2016-05-21 NOTE — Telephone Encounter (Signed)
Patient needs a prior authorization on his Belsomra.  [facility] would not refill it without the authorization.  Please call patient when this is done.

## 2016-05-22 NOTE — Telephone Encounter (Signed)
Prior auth [facility]ed to [facility]

## 2016-05-29 NOTE — Telephone Encounter (Signed)
Prior Berkley Harvey was approved for Belsomra. Mr [place] filled in July

## 2016-08-10 ENCOUNTER — Other Ambulatory Visit: Payer: Self-pay | Admitting: Physical Medicine & Rehabilitation

## 2016-09-04 ENCOUNTER — Other Ambulatory Visit: Payer: Self-pay | Admitting: Physical Medicine & Rehabilitation

## 2016-09-07 ENCOUNTER — Telehealth: Payer: Self-pay | Admitting: Physical Medicine & Rehabilitation

## 2016-09-07 NOTE — Telephone Encounter (Signed)
Approval given

## 2016-09-07 NOTE — Telephone Encounter (Signed)
[facility] for [facility] on Clonazepam.  Please call her at [phone].

## 2016-09-14 ENCOUNTER — Encounter: Payer: Self-pay | Admitting: Physical Medicine & Rehabilitation

## 2016-09-14 ENCOUNTER — Encounter: Payer: Medicaid Other | Attending: Physical Medicine & Rehabilitation | Admitting: Physical Medicine & Rehabilitation

## 2016-09-14 VITALS — BP 114/76 | HR 67

## 2016-09-14 DIAGNOSIS — F5101 Primary insomnia: Secondary | ICD-10-CM | POA: Diagnosis not present

## 2016-09-14 DIAGNOSIS — G4700 Insomnia, unspecified: Secondary | ICD-10-CM | POA: Diagnosis not present

## 2016-09-14 DIAGNOSIS — S39012S Strain of muscle, fascia and tendon of lower back, sequela: Secondary | ICD-10-CM

## 2016-09-14 DIAGNOSIS — S39012A Strain of muscle, fascia and tendon of lower back, initial encounter: Secondary | ICD-10-CM | POA: Diagnosis not present

## 2016-09-14 DIAGNOSIS — Z9889 Other specified postprocedural states: Secondary | ICD-10-CM | POA: Diagnosis not present

## 2016-09-14 DIAGNOSIS — Z87820 Personal history of traumatic brain injury: Secondary | ICD-10-CM | POA: Insufficient documentation

## 2016-09-14 DIAGNOSIS — F419 Anxiety disorder, unspecified: Secondary | ICD-10-CM | POA: Diagnosis not present

## 2016-09-14 DIAGNOSIS — S069X0S Unspecified intracranial injury without loss of consciousness, sequela: Secondary | ICD-10-CM

## 2016-09-14 DIAGNOSIS — F068 Other specified mental disorders due to known physiological condition: Secondary | ICD-10-CM

## 2016-09-14 DIAGNOSIS — Z79899 Other long term (current) drug therapy: Secondary | ICD-10-CM | POA: Insufficient documentation

## 2016-09-14 DIAGNOSIS — M549 Dorsalgia, unspecified: Secondary | ICD-10-CM | POA: Diagnosis not present

## 2016-09-14 DIAGNOSIS — X58XXXA Exposure to other specified factors, initial encounter: Secondary | ICD-10-CM | POA: Diagnosis not present

## 2016-09-14 DIAGNOSIS — F0781 Postconcussional syndrome: Secondary | ICD-10-CM

## 2016-09-14 MED ORDER — CLONAZEPAM 1 MG PO TABS: 1.0000 mg | ORAL_TABLET | Freq: Every day | ORAL | 3 refills | Status: DC

## 2016-09-14 MED ORDER — AMPHETAMINE-DEXTROAMPHET ER 15 MG PO CP24: 15.0000 mg | ORAL_CAPSULE | ORAL | 0 refills | Status: DC

## 2016-09-14 MED ORDER — TRAZODONE HCL 100 MG PO TABS: 300.0000 mg | ORAL_TABLET | Freq: Every day | ORAL | 3 refills | Status: DC

## 2016-09-14 MED ORDER — SUVOREXANT 10 MG PO TABS: 1.0000 | ORAL_TABLET | Freq: Every day | ORAL | 5 refills | Status: DC

## 2016-09-14 NOTE — Patient Instructions (Signed)
Low Back Sprain With Rehab A sprain is an injury in which a ligament is torn. The ligaments of the lower back are vulnerable to sprains. However, they are strong and require great force to be injured. These ligaments are important for stabilizing the spinal column. Sprains are classified into three categories. Grade 1 sprains cause pain, but the tendon is not lengthened. Grade 2 sprains include a lengthened ligament, due to the ligament being stretched or partially ruptured. With grade 2 sprains there is still function, although the function may be decreased. Grade 3 sprains involve a complete tear of the tendon or muscle, and function is usually impaired. SYMPTOMS   Severe pain in the lower back.  Sometimes, a feeling of a "pop," "snap," or tear, at the time of injury.  Tenderness and sometimes swelling at the injury site.  Uncommonly, bruising (contusion) within 48 hours of injury.  Muscle spasms in the back. CAUSES  Low back sprains occur when a force is placed on the ligaments that is greater than they can handle. Common causes of injury include:  Performing a stressful act while off-balance.  Repetitive stressful activities that involve movement of the lower back.  Direct hit (trauma) to the lower back. RISK INCREASES WITH:  Contact sports (football, wrestling).  Collisions (major skiing accidents).  Sports that require throwing or lifting (baseball, weightlifting).  Sports involving twisting of the spine (gymnastics, diving, tennis, golf).  Poor strength and flexibility.  Inadequate protection.  Previous back injury or surgery (especially fusion). PREVENTION  Wear properly fitted and padded protective equipment.  Warm up and stretch properly before activity.  Allow for adequate recovery between workouts.  Maintain physical fitness:  Strength, flexibility, and endurance.  Cardiovascular fitness.  Maintain a healthy body weight. PROGNOSIS  If treated properly,  low back sprains usually heal with non-surgical treatment. The length of time for healing depends on the severity of the injury.  RELATED COMPLICATIONS   Recurring symptoms, resulting in a chronic problem.  Chronic inflammation and pain in the low back.  Delayed healing or resolution of symptoms, especially if activity is resumed too soon.  Prolonged impairment.  Unstable or arthritic joints of the low back. TREATMENT  Treatment first involves the use of ice and medicine, to reduce pain and inflammation. The use of strengthening and stretching exercises may help reduce pain with activity. These exercises may be performed at home or with a therapist. Severe injuries may require referral to a therapist for further evaluation and treatment, such as ultrasound. Your caregiver may advise that you wear a back brace or corset, to help reduce pain and discomfort. Often, prolonged bed rest results in greater harm then benefit. Corticosteroid injections may be recommended. However, these should be reserved for the most serious cases. It is important to avoid using your back when lifting objects. At night, sleep on your back on a firm mattress, with a pillow placed under your knees. If non-surgical treatment is unsuccessful, surgery may be needed.  MEDICATION   If pain medicine is needed, nonsteroidal anti-inflammatory medicines (aspirin and ibuprofen), or other minor pain relievers (acetaminophen), are often advised.  Do not take pain medicine for 7 days before surgery.  Prescription pain relievers may be given, if your caregiver thinks they are needed. Use only as directed and only as much as you need.  Ointments applied to the skin may be helpful.  Corticosteroid injections may be given by your caregiver. These injections should be reserved for the most serious cases, because   they may only be given a certain number of times. HEAT AND COLD  Cold treatment (icing) should be applied for 10 to 15  minutes every 2 to 3 hours for inflammation and pain, and immediately after activity that aggravates your symptoms. Use ice packs or an ice massage.  Heat treatment may be used before performing stretching and strengthening activities prescribed by your caregiver, physical therapist, or athletic trainer. Use a heat pack or a warm water soak. SEEK MEDICAL CARE IF:   Symptoms get worse or do not improve in 2 to 4 weeks, despite treatment.  You develop numbness or weakness in either leg.  You lose bowel or bladder function.  Any of the following occur after surgery: fever, increased pain, swelling, redness, drainage of fluids, or bleeding in the affected area.  New, unexplained symptoms develop. (Drugs used in treatment may produce side effects.) EXERCISES  RANGE OF MOTION (ROM) AND STRETCHING EXERCISES - Low Back Sprain Most people with lower back pain will find that their symptoms get worse with excessive bending forward (flexion) or arching at the lower back (extension). The exercises that will help resolve your symptoms will focus on the opposite motion.  Your physician, physical therapist or athletic trainer will help you determine which exercises will be most helpful to resolve your lower back pain. Do not complete any exercises without first consulting with your caregiver. Discontinue any exercises which make your symptoms worse, until you speak to your caregiver. If you have pain, numbness or tingling which travels down into your buttocks, leg or foot, the goal of the therapy is for these symptoms to move closer to your back and eventually resolve. Sometimes, these leg symptoms will get better, but your lower back pain may worsen. This is often an indication of progress in your rehabilitation. Be very alert to any changes in your symptoms and the activities in which you participated in the 24 hours prior to the change. Sharing this information with your caregiver will allow him or her to most  efficiently treat your condition. These exercises may help you when beginning to rehabilitate your injury. Your symptoms may resolve with or without further involvement from your physician, physical therapist or athletic trainer. While completing these exercises, remember:   Restoring tissue flexibility helps normal motion to return to the joints. This allows healthier, less painful movement and activity.  An effective stretch should be held for at least 30 seconds.  A stretch should never be painful. You should only feel a gentle lengthening or release in the stretched tissue. FLEXION RANGE OF MOTION AND STRETCHING EXERCISES: STRETCH - Flexion, Single Knee to Chest   Lie on a firm bed or floor with both legs extended in front of you.  Keeping one leg in contact with the floor, bring your opposite knee to your chest. Hold your leg in place by either grabbing behind your thigh or at your knee.  Pull until you feel a gentle stretch in your low back. Hold __________ seconds.  Slowly release your grasp and repeat the exercise with the opposite side. Repeat __________ times. Complete this exercise __________ times per day.  STRETCH - Flexion, Double Knee to Chest  Lie on a firm bed or floor with both legs extended in front of you.  Keeping one leg in contact with the floor, bring your opposite knee to your chest.  Tense your stomach muscles to support your back and then lift your other knee to your chest. Hold your legs in   place by either grabbing behind your thighs or at your knees.  Pull both knees toward your chest until you feel a gentle stretch in your low back. Hold __________ seconds.  Tense your stomach muscles and slowly return one leg at a time to the floor. Repeat __________ times. Complete this exercise __________ times per day.  STRETCH - Low Trunk Rotation  Lie on a firm bed or floor. Keeping your legs in front of you, bend your knees so they are both pointed toward the  ceiling and your feet are flat on the floor.  Extend your arms out to the side. This will stabilize your upper body by keeping your shoulders in contact with the floor.  Gently and slowly drop both knees together to one side until you feel a gentle stretch in your low back. Hold for __________ seconds.  Tense your stomach muscles to support your lower back as you bring your knees back to the starting position. Repeat the exercise to the other side. Repeat __________ times. Complete this exercise __________ times per day  EXTENSION RANGE OF MOTION AND FLEXIBILITY EXERCISES: STRETCH - Extension, Prone on Elbows   Lie on your stomach on the floor, a bed will be too soft. Place your palms about shoulder width apart and at the height of your head.  Place your elbows under your shoulders. If this is too painful, stack pillows under your chest.  Allow your body to relax so that your hips drop lower and make contact more completely with the floor.  Hold this position for __________ seconds.  Slowly return to lying flat on the floor. Repeat __________ times. Complete this exercise __________ times per day.  RANGE OF MOTION - Extension, Prone Press Ups  Lie on your stomach on the floor, a bed will be too soft. Place your palms about shoulder width apart and at the height of your head.  Keeping your back as relaxed as possible, slowly straighten your elbows while keeping your hips on the floor. You may adjust the placement of your hands to maximize your comfort. As you gain motion, your hands will come more underneath your shoulders.  Hold this position __________ seconds.  Slowly return to lying flat on the floor. Repeat __________ times. Complete this exercise __________ times per day.  RANGE OF MOTION- Quadruped, Neutral Spine   Assume a hands and knees position on a firm surface. Keep your hands under your shoulders and your knees under your hips. You may place padding under your knees for  comfort.  Drop your head and point your tailbone toward the ground below you. This will round out your lower back like an angry cat. Hold this position for __________ seconds.  Slowly lift your head and release your tail bone so that your back sags into a large arch, like an old horse.  Hold this position for __________ seconds.  Repeat this until you feel limber in your low back.  Now, find your "sweet spot." This will be the most comfortable position somewhere between the two previous positions. This is your neutral spine. Once you have found this position, tense your stomach muscles to support your low back.  Hold this position for __________ seconds. Repeat __________ times. Complete this exercise __________ times per day.  STRENGTHENING EXERCISES - Low Back Sprain These exercises may help you when beginning to rehabilitate your injury. These exercises should be done near your "sweet spot." This is the neutral, low-back arch, somewhere between fully rounded and   fully arched, that is your least painful position. When performed in this safe range of motion, these exercises can be used for people who have either a flexion or extension based injury. These exercises may resolve your symptoms with or without further involvement from your physician, physical therapist or athletic trainer. While completing these exercises, remember:   Muscles can gain both the endurance and the strength needed for everyday activities through controlled exercises.  Complete these exercises as instructed by your physician, physical therapist or athletic trainer. Increase the resistance and repetitions only as guided.  You may experience muscle soreness or fatigue, but the pain or discomfort you are trying to eliminate should never worsen during these exercises. If this pain does worsen, stop and make certain you are following the directions exactly. If the pain is still present after adjustments, discontinue the  exercise until you can discuss the trouble with your caregiver. STRENGTHENING - Deep Abdominals, Pelvic Tilt   Lie on a firm bed or floor. Keeping your legs in front of you, bend your knees so they are both pointed toward the ceiling and your feet are flat on the floor.  Tense your lower abdominal muscles to press your low back into the floor. This motion will rotate your pelvis so that your tail bone is scooping upwards rather than pointing at your feet or into the floor. With a gentle tension and even breathing, hold this position for __________ seconds. Repeat __________ times. Complete this exercise __________ times per day.  STRENGTHENING - Abdominals, Crunches   Lie on a firm bed or floor. Keeping your legs in front of you, bend your knees so they are both pointed toward the ceiling and your feet are flat on the floor. Cross your arms over your chest.  Slightly tip your chin down without bending your neck.  Tense your abdominals and slowly lift your trunk high enough to just clear your shoulder blades. Lifting higher can put excessive stress on the lower back and does not further strengthen your abdominal muscles.  Control your return to the starting position. Repeat __________ times. Complete this exercise __________ times per day.  STRENGTHENING - Quadruped, Opposite UE/LE Lift   Assume a hands and knees position on a firm surface. Keep your hands under your shoulders and your knees under your hips. You may place padding under your knees for comfort.  Find your neutral spine and gently tense your abdominal muscles so that you can maintain this position. Your shoulders and hips should form a rectangle that is parallel with the floor and is not twisted.  Keeping your trunk steady, lift your right hand no higher than your shoulder and then your left leg no higher than your hip. Make sure you are not holding your breath. Hold this position for __________ seconds.  Continuing to keep  your abdominal muscles tense and your back steady, slowly return to your starting position. Repeat with the opposite arm and leg. Repeat __________ times. Complete this exercise __________ times per day.  STRENGTHENING - Abdominals and Quadriceps, Straight Leg Raise   Lie on a firm bed or floor with both legs extended in front of you.  Keeping one leg in contact with the floor, bend the other knee so that your foot can rest flat on the floor.  Find your neutral spine, and tense your abdominal muscles to maintain your spinal position throughout the exercise.  Slowly lift your straight leg off the floor about 6 inches for a count of   15, making sure to not hold your breath.  Still keeping your neutral spine, slowly lower your leg all the way to the floor. Repeat this exercise with each leg __________ times. Complete this exercise __________ times per day. POSTURE AND BODY MECHANICS CONSIDERATIONS - Low Back Sprain Keeping correct posture when sitting, standing or completing your activities will reduce the stress put on different body tissues, allowing injured tissues a chance to heal and limiting painful experiences. The following are general guidelines for improved posture. Your physician or physical therapist will provide you with any instructions specific to your needs. While reading these guidelines, remember:  The exercises prescribed by your provider will help you have the flexibility and strength to maintain correct postures.  The correct posture provides the best environment for your joints to work. All of your joints have less wear and tear when properly supported by a spine with good posture. This means you will experience a healthier, less painful body.  Correct posture must be practiced with all of your activities, especially prolonged sitting and standing. Correct posture is as important when doing repetitive low-stress activities (typing) as it is when doing a single heavy-load  activity (lifting). RESTING POSITIONS Consider which positions are most painful for you when choosing a resting position. If you have pain with flexion-based activities (sitting, bending, stooping, squatting), choose a position that allows you to rest in a less flexed posture. You would want to avoid curling into a fetal position on your side. If your pain worsens with extension-based activities (prolonged standing, working overhead), avoid resting in an extended position such as sleeping on your stomach. Most people will find more comfort when they rest with their spine in a more neutral position, neither too rounded nor too arched. Lying on a non-sagging bed on your side with a pillow between your knees, or on your back with a pillow under your knees will often provide some relief. Keep in mind, being in any one position for a prolonged period of time, no matter how correct your posture, can still lead to stiffness. PROPER SITTING POSTURE In order to minimize stress and discomfort on your spine, you must sit with correct posture. Sitting with good posture should be effortless for a healthy body. Returning to good posture is a gradual process. Many people can work toward this most comfortably by using various supports until they have the flexibility and strength to maintain this posture on their own. When sitting with proper posture, your ears will fall over your shoulders and your shoulders will fall over your hips. You should use the back of the chair to support your upper back. Your lower back will be in a neutral position, just slightly arched. You may place a small pillow or folded towel at the base of your lower back for  support.  When working at a desk, create an environment that supports good, upright posture. Without extra support, muscles tire, which leads to excessive strain on joints and other tissues. Keep these recommendations in mind: CHAIR:  A chair should be able to slide under your desk  when your back makes contact with the back of the chair. This allows you to work closely.  The chair's height should allow your eyes to be level with the upper part of your monitor and your hands to be slightly lower than your elbows. BODY POSITION  Your feet should make contact with the floor. If this is not possible, use a foot rest.  Keep your ears   over your shoulders. This will reduce stress on your neck and low back. INCORRECT SITTING POSTURES  If you are feeling tired and unable to assume a healthy sitting posture, do not slouch or slump. This puts excessive strain on your back tissues, causing more damage and pain. Healthier options include:  Using more support, like a lumbar pillow.  Switching tasks to something that requires you to be upright or walking.  Talking a brief walk.  Lying down to rest in a neutral-spine position. PROLONGED STANDING WHILE SLIGHTLY LEANING FORWARD  When completing a task that requires you to lean forward while standing in one place for a long time, place either foot up on a stationary 2-4 inch high object to help maintain the best posture. When both feet are on the ground, the lower back tends to lose its slight inward curve. If this curve flattens (or becomes too large), then the back and your other joints will experience too much stress, tire more quickly, and can cause pain. CORRECT STANDING POSTURES Proper standing posture should be assumed with all daily activities, even if they only take a few moments, like when brushing your teeth. As in sitting, your ears should fall over your shoulders and your shoulders should fall over your hips. You should keep a slight tension in your abdominal muscles to brace your spine. Your tailbone should point down to the ground, not behind your body, resulting in an over-extended swayback posture.  INCORRECT STANDING POSTURES  Common incorrect standing postures include a forward head, locked knees and/or an excessive  swayback. WALKING Walk with an upright posture. Your ears, shoulders and hips should all line-up. PROLONGED ACTIVITY IN A FLEXED POSITION When completing a task that requires you to bend forward at your waist or lean over a low surface, try to find a way to stabilize 3 out of 4 of your limbs. You can place a hand or elbow on your thigh or rest a knee on the surface you are reaching across. This will provide you more stability, so that your muscles do not tire as quickly. By keeping your knees relaxed, or slightly bent, you will also reduce stress across your lower back. CORRECT LIFTING TECHNIQUES DO :  Assume a wide stance. This will provide you more stability and the opportunity to get as close as possible to the object which you are lifting.  Tense your abdominals to brace your spine. Bend at the knees and hips. Keeping your back locked in a neutral-spine position, lift using your leg muscles. Lift with your legs, keeping your back straight.  Test the weight of unknown objects before attempting to lift them.  Try to keep your elbows locked down at your sides in order get the best strength from your shoulders when carrying an object.  Always ask for help when lifting heavy or awkward objects. INCORRECT LIFTING TECHNIQUES DO NOT:   Lock your knees when lifting, even if it is a small object.  Bend and twist. Pivot at your feet or move your feet when needing to change directions.  Assume that you can safely pick up even a paperclip without proper posture.   This information is not intended to replace advice given to you by your health care provider. Make sure you discuss any questions you have with your health care provider.   Document Released: 10/19/2005 Document Revised: 11/09/2014 Document Reviewed: 01/31/2009 [employer] Patient Education 2016 [employer]  

## 2016-09-14 NOTE — Progress Notes (Signed)
Subjective:    Patient ID: Frank Frye, male    DOB: [birth year], 30 y.o.   MRN: [id number]  HPI   [name] is here in follow up of his TBI. He hurt his back a few weeks ago after shoveling some dirt. He notices it when he bends over. The back pain does not radiate apparently. He was placed on a course of prednisone by his PCP which provided some relief. The pain had inhibited picking up simple objects and performing tasks around the house.    He continues to work with [employer] [place] vocational re-entry. They seemed to notice he did better cognitively with his left over adderall then he did with the ritalin.       Pain Inventory Average Pain 2 Pain Right Now 1 My pain is dull  In the last 24 hours, has pain interfered with the following? General activity 2 Relation with others 0 Enjoyment of life 2 What TIME of day is your pain at its worst? morning Sleep (in general) Fair  Pain is worse with: bending Pain improves with: rest Relief from Meds: na  Mobility ability to climb steps?  yes do you drive?  yes  Function not employed: date last employed 2015  Neuro/Psych No problems in this area  Prior Studies Any changes since last visit?  no  Physicians involved in your care Any changes since last visit?  no   History reviewed. No pertinent family history. Social History   Social History  . Marital status: Single    Spouse name: N/A  . Number of children: N/A  . Years of education: N/A   Social History Main Topics  . Smoking status: Never Smoker  . Smokeless tobacco: Never Used  . Alcohol use Yes  . Drug use: Unknown  . Sexual activity: Not Asked   Other Topics Concern  . None   Social History Narrative  . None   Past Surgical History:  Procedure Laterality Date  . FEMUR FRACTURE SURGERY    . NOSE SURGERY    . WISDOM TOOTH EXTRACTION     Past Medical History:  Diagnosis Date  . Traumatic brain injury (HCC)    2009    BP 114/76   Pulse 67   SpO2 96%   Opioid Risk Score:   Fall Risk Score:  `1  Depression screen PHQ 2/9  No flowsheet data found.  Review of Systems  Constitutional: Negative.   HENT: Negative.   Eyes: Negative.   Respiratory: Negative.   Cardiovascular: Negative.   Gastrointestinal: Negative.   Endocrine: Negative.   Genitourinary: Negative.   Musculoskeletal: Negative.   Skin: Negative.   Allergic/Immunologic: Negative.   Neurological: Negative.   Hematological: Negative.   Psychiatric/Behavioral: Negative.        Objective:   Physical Exam  General: Alert and oriented x 3, nad  HEENT: Head is normocephalic, atraumatic, PERRLA, EOMI, sclera anicteric, oral mucosa pink and moist, dentition intact, ext ear canals clear,  Neck: Supple. No jvd or lymphadenopathy  Heart:  RRR. No murmurs rubs or gallops  Chest: CTA bilaterally without wheezes, rales, or rhonchi; no distress  Abdomen: Soft, non-tender,BS+.  Extremities: No clubbing, cyanosis, or edema. Pulses are present Skin: Clean and intact without signs of breakdown  Neuro: word finding deficits. Decreased attention at baseline.  Musculoskeletal: decreased lumbar ROM, No pain with AROM or PROM in the neck, trunk, or extremities. Posture fair. Right lumbaraspinals taut. Pain with flexion and  right lateral bending.  Tends to step down to right leg during ambulation. Psych: Pt's affect is appropriate. Pt is cooperative and pleasant as always.    Assessment & Plan:   1. 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  3. Low back strain.    Plan:  1. Klonopin for now at same dose  2. Continue trazodone  3. Belsomra 10mg  qhs has been effective. Refilled today.  4. Continue with efforts in regard to sleep hygiene and a daily routine. I stressed the routine in detail today! 5. Change back to adderall due to better efficacy than  ritalin  -continue vocational efforts.  6. Naproxen OTC for low back pain.   -heat  -lumbar exercises were provided and reviewed  -consider imaging if continued, progressive pain 7. Follow up with me in 2 months. 25 minutes of face to face patient care time were spent during this visit. All questions were encouraged and answered.

## 2016-11-09 ENCOUNTER — Encounter: Payer: Medicaid Other | Attending: Physical Medicine & Rehabilitation | Admitting: Physical Medicine & Rehabilitation

## 2016-11-09 ENCOUNTER — Encounter: Payer: Self-pay | Admitting: Physical Medicine & Rehabilitation

## 2016-11-09 VITALS — BP 116/75 | HR 73 | Resp 14

## 2016-11-09 DIAGNOSIS — Z79899 Other long term (current) drug therapy: Secondary | ICD-10-CM | POA: Insufficient documentation

## 2016-11-09 DIAGNOSIS — F419 Anxiety disorder, unspecified: Secondary | ICD-10-CM | POA: Diagnosis not present

## 2016-11-09 DIAGNOSIS — S069X0S Unspecified intracranial injury without loss of consciousness, sequela: Secondary | ICD-10-CM | POA: Diagnosis not present

## 2016-11-09 DIAGNOSIS — F0781 Postconcussional syndrome: Secondary | ICD-10-CM

## 2016-11-09 DIAGNOSIS — Z9889 Other specified postprocedural states: Secondary | ICD-10-CM | POA: Diagnosis not present

## 2016-11-09 DIAGNOSIS — G4700 Insomnia, unspecified: Secondary | ICD-10-CM | POA: Diagnosis not present

## 2016-11-09 DIAGNOSIS — G8929 Other chronic pain: Secondary | ICD-10-CM

## 2016-11-09 DIAGNOSIS — M545 Low back pain, unspecified: Secondary | ICD-10-CM | POA: Insufficient documentation

## 2016-11-09 DIAGNOSIS — F068 Other specified mental disorders due to known physiological condition: Secondary | ICD-10-CM | POA: Diagnosis not present

## 2016-11-09 DIAGNOSIS — M549 Dorsalgia, unspecified: Secondary | ICD-10-CM | POA: Insufficient documentation

## 2016-11-09 DIAGNOSIS — Z87820 Personal history of traumatic brain injury: Secondary | ICD-10-CM | POA: Diagnosis not present

## 2016-11-09 DIAGNOSIS — S39012A Strain of muscle, fascia and tendon of lower back, initial encounter: Secondary | ICD-10-CM | POA: Diagnosis not present

## 2016-11-09 MED ORDER — TRAZODONE HCL 100 MG PO TABS: 300.0000 mg | ORAL_TABLET | Freq: Every day | ORAL | 3 refills | Status: DC

## 2016-11-09 MED ORDER — AMPHETAMINE-DEXTROAMPHETAMINE 7.5 MG PO TABS: 7.5000 mg | ORAL_TABLET | Freq: Two times a day (BID) | ORAL | 0 refills | Status: DC

## 2016-11-09 NOTE — Patient Instructions (Signed)
PLEASE CALL ME WITH ANY PROBLEMS OR QUESTIONS ([phone])  

## 2016-11-09 NOTE — Progress Notes (Signed)
Subjective:    Patient ID: Frank Frye, male    DOB: [birth year], 31 y.o.   MRN: [id number]  HPI  [name] is back regarding his TBI. He states his back is better with exercise and rest. His pain is minimal now. He states that the adderall XR kept him awake and wanted to know if he could use the IR version.   He continues to work with [employer] on job re-entry. He is working on [employer] interviews and re-entry at present.   He remains on trazodone and belsomra as well as klonopin for sleep which together have been effective except when he's used the adderall xr.    Pain Inventory Average Pain 1 Pain Right Now 0 My pain is tingling  In the last 24 hours, has pain interfered with the following? General activity 1 Relation with others 0 Enjoyment of life 0 What TIME of day is your pain at its worst? No pain Sleep (in general) Good  Pain is worse with: standing Pain improves with: rest Relief from Meds: 9  Mobility walk without assistance how many minutes can you walk? 60+ ability to climb steps?  yes  Function Do you have any goals in this area?  no  Neuro/Psych No problems in this area tingling  Prior Studies Any changes since last visit?  no  Physicians involved in your care Any changes since last visit?  no   No family history on file. Social History   Social History  . Marital status: Single    Spouse name: N/A  . Number of children: N/A  . Years of education: N/A   Social History Main Topics  . Smoking status: Never Smoker  . Smokeless tobacco: Never Used  . Alcohol use Yes  . Drug use: Unknown  . Sexual activity: Not Asked   Other Topics Concern  . None   Social History Narrative  . None   Past Surgical History:  Procedure Laterality Date  . FEMUR FRACTURE SURGERY    . NOSE SURGERY    . WISDOM TOOTH EXTRACTION     Past Medical History:  Diagnosis Date  . Traumatic brain injury (HCC)    2009   BP 116/75 (BP Location: Left Arm, Patient  Position: Sitting, Cuff Size: Small)   Pulse 73   Resp 14   SpO2 98%   Opioid Risk Score:   Fall Risk Score:  `1  Depression screen PHQ 2/9  No flowsheet data found.   Review of Systems     Objective:   Physical Exam  General: Alert and oriented x 3, nad  HEENT:Head is normocephalic, atraumatic, PERRLA, EOMI, sclera anicteric, oral mucosa pink and moist, dentition intact, ext ear canals clear,  Neck:Supple. No jvd or lymphadenopathy  Heart: RRR Chest:clear Abdomen:Soft, non-tender,BS+.  Extremities:No clubbing, cyanosis, or edema. Pulses are present Skin:Clean and intact without signs of breakdown  Neuro:word finding deficits. Decreased attention at baseline. Processing delays Musculoskeletal:improved lumbar ROM. Normal fair.   Marland Kitchen. Psych:Pt's affect is appropriate. Pt is cooperative and pleasant as always.    Assessment & Plan:  1. 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  3. Low back strain.    Plan:  1. Klonopin for now at same dose  2. Continue trazodone. Refilled this today 3. Belsomra 10mg  qhs has been effective.  .  4. Continue with efforts in regard to sleep hygiene and a daily routine. I stressed  the routine in detail today! 5. Changed to adderall IR 7.5mg  bid #60. Second rx for next month. Can increase if needed.   -can come to pick up refills in about 2 months.  6. HEP for low back. 7. Follow up with me in 4 months. 25 minutes of face to face patient care time were spent during this visit. All questions were encouraged and answered. I am pleased with his vocational efforts.

## 2016-11-20 ENCOUNTER — Other Ambulatory Visit: Payer: Self-pay | Admitting: Physical Medicine & Rehabilitation

## 2016-11-20 DIAGNOSIS — R4189 Other symptoms and signs involving cognitive functions and awareness: Secondary | ICD-10-CM

## 2016-11-20 DIAGNOSIS — F0781 Postconcussional syndrome: Secondary | ICD-10-CM

## 2016-11-20 DIAGNOSIS — S069X0S Unspecified intracranial injury without loss of consciousness, sequela: Principal | ICD-10-CM

## 2016-11-20 DIAGNOSIS — F068 Other specified mental disorders due to known physiological condition: Secondary | ICD-10-CM

## 2016-11-20 DIAGNOSIS — F5101 Primary insomnia: Secondary | ICD-10-CM

## 2016-11-23 ENCOUNTER — Telehealth: Payer: Self-pay | Admitting: *Deleted

## 2016-11-23 NOTE — Telephone Encounter (Signed)
Patient left a message asking about belsomra, prior authorization.  Prior authorization submitted.to [employer]. Patient notified

## 2017-01-31 ENCOUNTER — Other Ambulatory Visit: Payer: Self-pay | Admitting: Physical Medicine & Rehabilitation

## 2017-03-03 ENCOUNTER — Other Ambulatory Visit: Payer: Self-pay | Admitting: Physical Medicine & Rehabilitation

## 2017-03-15 ENCOUNTER — Encounter: Payer: Medicaid Other | Admitting: Physical Medicine & Rehabilitation

## 2017-03-18 ENCOUNTER — Other Ambulatory Visit: Payer: Self-pay | Admitting: Physical Medicine & Rehabilitation

## 2017-03-18 DIAGNOSIS — S069X0S Unspecified intracranial injury without loss of consciousness, sequela: Principal | ICD-10-CM

## 2017-03-18 DIAGNOSIS — F0781 Postconcussional syndrome: Secondary | ICD-10-CM

## 2017-03-18 DIAGNOSIS — F068 Other specified mental disorders due to known physiological condition: Secondary | ICD-10-CM

## 2017-03-18 DIAGNOSIS — F5101 Primary insomnia: Secondary | ICD-10-CM

## 2017-03-18 DIAGNOSIS — S069XAS Unspecified intracranial injury with loss of consciousness status unknown, sequela: Secondary | ICD-10-CM

## 2017-04-07 ENCOUNTER — Encounter: Payer: Medicaid Other | Attending: Physical Medicine & Rehabilitation | Admitting: Physical Medicine & Rehabilitation

## 2017-04-07 ENCOUNTER — Encounter: Payer: Self-pay | Admitting: Physical Medicine & Rehabilitation

## 2017-04-07 VITALS — BP 131/81 | HR 61 | Resp 14

## 2017-04-07 DIAGNOSIS — Z79899 Other long term (current) drug therapy: Secondary | ICD-10-CM | POA: Insufficient documentation

## 2017-04-07 DIAGNOSIS — M545 Low back pain, unspecified: Secondary | ICD-10-CM

## 2017-04-07 DIAGNOSIS — Z5181 Encounter for therapeutic drug level monitoring: Secondary | ICD-10-CM | POA: Diagnosis not present

## 2017-04-07 DIAGNOSIS — X58XXXS Exposure to other specified factors, sequela: Secondary | ICD-10-CM | POA: Diagnosis not present

## 2017-04-07 DIAGNOSIS — G894 Chronic pain syndrome: Secondary | ICD-10-CM

## 2017-04-07 DIAGNOSIS — F419 Anxiety disorder, unspecified: Secondary | ICD-10-CM | POA: Diagnosis not present

## 2017-04-07 DIAGNOSIS — G8929 Other chronic pain: Secondary | ICD-10-CM | POA: Diagnosis not present

## 2017-04-07 DIAGNOSIS — F988 Other specified behavioral and emotional disorders with onset usually occurring in childhood and adolescence: Secondary | ICD-10-CM | POA: Diagnosis not present

## 2017-04-07 DIAGNOSIS — F5101 Primary insomnia: Secondary | ICD-10-CM

## 2017-04-07 DIAGNOSIS — F0781 Postconcussional syndrome: Secondary | ICD-10-CM

## 2017-04-07 DIAGNOSIS — S069X0S Unspecified intracranial injury without loss of consciousness, sequela: Secondary | ICD-10-CM | POA: Diagnosis present

## 2017-04-07 DIAGNOSIS — S39012A Strain of muscle, fascia and tendon of lower back, initial encounter: Secondary | ICD-10-CM | POA: Diagnosis not present

## 2017-04-07 DIAGNOSIS — G4700 Insomnia, unspecified: Secondary | ICD-10-CM | POA: Diagnosis not present

## 2017-04-07 DIAGNOSIS — F068 Other specified mental disorders due to known physiological condition: Secondary | ICD-10-CM | POA: Diagnosis not present

## 2017-04-07 DIAGNOSIS — X58XXXA Exposure to other specified factors, initial encounter: Secondary | ICD-10-CM | POA: Insufficient documentation

## 2017-04-07 MED ORDER — AMPHETAMINE-DEXTROAMPHETAMINE 7.5 MG PO TABS: 7.5000 mg | ORAL_TABLET | Freq: Two times a day (BID) | ORAL | 0 refills | Status: DC

## 2017-04-07 MED ORDER — SUVOREXANT 10 MG PO TABS: 1.0000 | ORAL_TABLET | Freq: Every day | ORAL | 5 refills | Status: DC

## 2017-04-07 MED ORDER — CLONAZEPAM 1 MG PO TABS: 1.0000 mg | ORAL_TABLET | Freq: Every day | ORAL | 5 refills | Status: DC

## 2017-04-07 NOTE — Patient Instructions (Signed)
TAKE 2 ADDERALL IF YOU ARE GOING TO WORK/[employer]/NEED TO FOCUS AT A HIGHER LEVEL

## 2017-04-07 NOTE — Progress Notes (Signed)
Subjective:    Patient ID: Frank Frye, male    DOB: 1986-03-14, 31 y.o.   MRN: [id number]  HPI   [name] is here in follow up of his TBI and associated deficits. 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   His sleep is fairly stable now. He does feel fatigued in the morning at times.   The adderall is working well for him. He occasionally will take two at a time in the morning when he needs to be more alert. He doesn't always use the mid day dose. We stopped the ER version as it affected his sleep.      Pain Inventory Average Pain 0 Pain Right Now 0 My pain is no pain  In the last 24 hours, has pain interfered with the following? General activity 0 Relation with others 0 Enjoyment of life 0 What TIME of day is your pain at its worst? no pain Sleep (in general) Fair  Pain is worse with: no pain Pain improves with: no pain Relief from Meds: no pain  Mobility walk without assistance  Function disabled: date disabled .  Neuro/Psych confusion  Prior Studies Any changes since last visit?  no  Physicians involved in your care Any changes since last visit?  no   History reviewed. No pertinent family history. Social History   Social History  . Marital status: Single    Spouse name: N/A  . Number of children: N/A  . Years of education: N/A   Social History Main Topics  . Smoking status: Never Smoker  . Smokeless tobacco: Never Used  . Alcohol use Yes  . Drug use: Unknown  . Sexual activity: Not Asked   Other Topics Concern  . None   Social History Narrative  . None   Past Surgical History:  Procedure Laterality Date  . FEMUR FRACTURE SURGERY    . NOSE SURGERY    . WISDOM TOOTH EXTRACTION     Past Medical History:  Diagnosis Date  . Traumatic brain injury (HCC)    2009   BP 131/81 (BP Location: Right Arm, Patient Position: Sitting, Cuff Size: Normal)   Pulse 61    Resp 14   SpO2 99%   Opioid Risk Score:   Fall Risk Score:  `1  Depression screen PHQ 2/9  No flowsheet data found.  Review of Systems  Constitutional: Negative.   HENT: Negative.   Eyes: Negative.   Respiratory: Negative.   Cardiovascular: Negative.   Gastrointestinal: Negative.   Endocrine: Negative.   Genitourinary: Negative.   Musculoskeletal: Negative.   Skin: Negative.   Allergic/Immunologic: Negative.   Neurological: Negative.   Hematological: Negative.   Psychiatric/Behavioral: Positive for confusion and decreased concentration.  All other systems reviewed and are negative.      Objective:   Physical Exam  General: Alert and oriented x 3, nad HEENT:Head is normocephalic, atraumatic, PERRLA, EOMI, sclera anicteric, oral mucosa pink and moist, dentition intact, ext ear canals clear,  Neck:Supple. No jvd or lymphadenopathy  Heart:RRR Chest:normal effort Abdomen:Soft, non-tender,BS+.  Extremities:No clubbing, cyanosis, or edema. Pulses are present Skin:Clean and intact without signs of breakdown  Neuro:word finding deficits. Attention and processing at baseline Musculoskeletal:improved lumbar ROM. Normal fair.   Marland Kitchen. Psych:Pt's affect is appropriate. Pt is cooperative and pleasant as always.    Assessment & Plan:  1. 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  3. Low back strain.    Plan:  1. Klonopin for now at same dose. REFILLED TODAY 2. Continue trazodone. 3. Belsomra 10mg  qhs has been effective.  REFILLED TODAY  4. Continue efforts with [employer] 5. Changed to adderall IR 7.5mg  1-2 tabs   bid #75. Second rx for next month.              -can come to pick up refills in about 2 months.   -UDS today 6. HEP for low back. 7. Follow up with me in 6months. of face to face patient care time were spent during this visit. All questions were encouraged and  answered. I am pleased with his vocational efforts.

## 2017-04-13 ENCOUNTER — Telehealth: Payer: Self-pay | Admitting: *Deleted

## 2017-04-13 LAB — TOXASSURE SELECT,+ANTIDEPR,UR

## 2017-04-13 NOTE — Telephone Encounter (Signed)
Urine drug screen for this encounter is consistent for prescribed medication but also is positive for alcohol.

## 2017-05-24 ENCOUNTER — Telehealth: Payer: Self-pay | Admitting: Physical Medicine & Rehabilitation

## 2017-05-24 NOTE — Telephone Encounter (Signed)
Patient is needing a prior authorization on his Belsomra. He is out of this medication and is needing it.  Please call patient.

## 2017-05-24 NOTE — Telephone Encounter (Signed)
Prior auth initiated with [employer] [employer] for Belsomra. Frank Frye notified.

## 2017-05-27 NOTE — Telephone Encounter (Signed)
Belsomra prior auth  approved through [facility] Confirmation #: [id number]  Effective Begin Date: 05/26/2017  Effective End Date: 11/22/2017  Approval faxed to pharmacy

## 2017-06-14 ENCOUNTER — Encounter: Payer: Self-pay | Admitting: Physical Medicine & Rehabilitation

## 2017-06-14 ENCOUNTER — Encounter: Payer: Medicaid Other | Attending: Physical Medicine & Rehabilitation | Admitting: Physical Medicine & Rehabilitation

## 2017-06-14 ENCOUNTER — Ambulatory Visit
Admission: RE | Admit: 2017-06-14 | Discharge: 2017-06-14 | Disposition: A | Discharge status: Home / Self Care | Payer: Medicaid Other | Source: Ambulatory Visit | Attending: Physical Medicine & Rehabilitation | Admitting: Physical Medicine & Rehabilitation

## 2017-06-14 VITALS — BP 112/71 | HR 67 | Wt 152.8 lb

## 2017-06-14 DIAGNOSIS — F988 Other specified behavioral and emotional disorders with onset usually occurring in childhood and adolescence: Secondary | ICD-10-CM | POA: Diagnosis not present

## 2017-06-14 DIAGNOSIS — S39012A Strain of muscle, fascia and tendon of lower back, initial encounter: Secondary | ICD-10-CM | POA: Insufficient documentation

## 2017-06-14 DIAGNOSIS — Z79899 Other long term (current) drug therapy: Secondary | ICD-10-CM | POA: Insufficient documentation

## 2017-06-14 DIAGNOSIS — M545 Low back pain, unspecified: Secondary | ICD-10-CM

## 2017-06-14 DIAGNOSIS — F419 Anxiety disorder, unspecified: Secondary | ICD-10-CM | POA: Insufficient documentation

## 2017-06-14 DIAGNOSIS — G8929 Other chronic pain: Secondary | ICD-10-CM

## 2017-06-14 DIAGNOSIS — G4700 Insomnia, unspecified: Secondary | ICD-10-CM | POA: Insufficient documentation

## 2017-06-14 DIAGNOSIS — F068 Other specified mental disorders due to known physiological condition: Secondary | ICD-10-CM | POA: Diagnosis not present

## 2017-06-14 DIAGNOSIS — S069X0S Unspecified intracranial injury without loss of consciousness, sequela: Secondary | ICD-10-CM | POA: Insufficient documentation

## 2017-06-14 MED ORDER — METHOCARBAMOL 500 MG PO TABS: 500.0000 mg | ORAL_TABLET | Freq: Three times a day (TID) | ORAL | 2 refills | Status: DC | PRN

## 2017-06-14 MED ORDER — DICLOFENAC SODIUM 75 MG PO TBEC: 75.0000 mg | DELAYED_RELEASE_TABLET | Freq: Two times a day (BID) | ORAL | 3 refills | Status: DC

## 2017-06-14 NOTE — Patient Instructions (Signed)
Low Back Sprain Rehab  Ask your health care provider which exercises are safe for you. Do exercises exactly as told by your health care provider and adjust them as directed. It is normal to feel mild stretching, pulling, tightness, or discomfort as you do these exercises, but you should stop right away if you feel sudden pain or your pain gets worse. Do not begin these exercises until told by your health care provider.  Stretching and range of motion exercises  These exercises warm up your muscles and joints and improve the movement and flexibility of your back. These exercises also help to relieve pain, numbness, and tingling.  Exercise A: Lumbar rotation    1. Lie on your back on a firm surface and bend your knees.  2. Straighten your arms out to your sides so each arm forms an "L" shape with a side of your body (a 90 degree angle).  3. Slowly move both of your knees to one side of your body until you feel a stretch in your lower back. Try not to let your shoulders move off of the floor.  4. Hold for __________ seconds.  5. Tense your abdominal muscles and slowly move your knees back to the starting position.  6. Repeat this exercise on the other side of your body.  Repeat __________ times. Complete this exercise __________ times a day.  Exercise B: Prone extension on elbows    1. Lie on your abdomen on a firm surface.  2. Prop yourself up on your elbows.  3. Use your arms to help lift your chest up until you feel a gentle stretch in your abdomen and your lower back.  ? This will place some of your body weight on your elbows. If this is uncomfortable, try stacking pillows under your chest.  ? Your hips should stay down, against the surface that you are lying on. Keep your hip and back muscles relaxed.  4. Hold for __________ seconds.  5. Slowly relax your upper body and return to the starting position.  Repeat __________ times. Complete this exercise __________ times a day.  Strengthening exercises  These  exercises build strength and endurance in your back. Endurance is the ability to use your muscles for a long time, even after they get tired.  Exercise C: Pelvic tilt  1. Lie on your back on a firm surface. Bend your knees and keep your feet flat.  2. Tense your abdominal muscles. Tip your pelvis up toward the ceiling and flatten your lower back into the floor.  ? To help with this exercise, you may place a small towel under your lower back and try to push your back into the towel.  3. Hold for __________ seconds.  4. Let your muscles relax completely before you repeat this exercise.  Repeat __________ times. Complete this exercise __________ times a day.  Exercise D: Alternating arm and leg raises    1. Get on your hands and knees on a firm surface. If you are on a hard floor, you may want to use padding to cushion your knees, such as an exercise mat.  2. Line up your arms and legs. Your hands should be below your shoulders, and your knees should be below your hips.  3. Lift your left leg behind you. At the same time, raise your right arm and straighten it in front of you.  ? Do not lift your leg higher than your hip.  ? Do not lift your arm   higher than your shoulder.  ? Keep your abdominal and back muscles tight.  ? Keep your hips facing the ground.  ? Do not arch your back.  ? Keep your balance carefully, and do not hold your breath.  4. Hold for __________ seconds.  5. Slowly return to the starting position and repeat with your right leg and your left arm.  Repeat __________ times. Complete this exercise __________ times a day.  Exercise E: Abdominal set with straight leg raise    1. Lie on your back on a firm surface.  2. Bend one of your knees and keep your other leg straight.  3. Tense your abdominal muscles and lift your straight leg up, 4-6 inches (10-15 cm) off the ground.  4. Keep your abdominal muscles tight and hold for __________ seconds.  ? Do not hold your breath.  ? Do not arch your back. Keep it  flat against the ground.  5. Keep your abdominal muscles tense as you slowly lower your leg back to the starting position.  6. Repeat with your other leg.  Repeat __________ times. Complete this exercise __________ times a day.  Posture and body mechanics    Body mechanics refers to the movements and positions of your body while you do your daily activities. Posture is part of body mechanics. Good posture and healthy body mechanics can help to relieve stress in your body's tissues and joints. Good posture means that your spine is in its natural S-curve position (your spine is neutral), your shoulders are pulled back slightly, and your head is not tipped forward. The following are general guidelines for applying improved posture and body mechanics to your everyday activities.  Standing    · When standing, keep your spine neutral and your feet about hip-width apart. Keep a slight bend in your knees. Your ears, shoulders, and hips should line up.  · When you do a task in which you stand in one place for a long time, place one foot up on a stable object that is 2-4 inches (5-10 cm) high, such as a footstool. This helps keep your spine neutral.  Sitting    · When sitting, keep your spine neutral and keep your feet flat on the floor. Use a footrest, if necessary, and keep your thighs parallel to the floor. Avoid rounding your shoulders, and avoid tilting your head forward.  · When working at a desk or a computer, keep your desk at a height where your hands are slightly lower than your elbows. Slide your chair under your desk so you are close enough to maintain good posture.  · When working at a computer, place your monitor at a height where you are looking straight ahead and you do not have to tilt your head forward or downward to look at the screen.  Resting    · When lying down and resting, avoid positions that are most painful for you.  · If you have pain with activities such as sitting, bending, stooping, or squatting  (flexion-based activities), lie in a position in which your body does not bend very much. For example, avoid curling up on your side with your arms and knees near your chest (fetal position).  · If you have pain with activities such as standing for a long time or reaching with your arms (extension-based activities), lie with your spine in a neutral position and bend your knees slightly. Try the following positions:  · Lying on your side with a   pillow between your knees.  · Lying on your back with a pillow under your knees.  Lifting    · When lifting objects, keep your feet at least shoulder-width apart and tighten your abdominal muscles.  · Bend your knees and hips and keep your spine neutral. It is important to lift using the strength of your legs, not your back. Do not lock your knees straight out.  · Always ask for help to lift heavy or awkward objects.  This information is not intended to replace advice given to you by your health care provider. Make sure you discuss any questions you have with your health care provider.  Document Released: 10/19/2005 Document Revised: 06/25/2016 Document Reviewed: 07/31/2015  Elsevier Interactive Patient Education © 2018 [employer] Inc[employer]

## 2017-06-14 NOTE — Progress Notes (Signed)
Subjective:    Patient ID: VIN YONKE, male    DOB: Oct 26, 1986, 31 y.o.   MRN: [id number]  HPI   Frank Frye is here in follow up of his TBI and associated deficits. He began working at [employer] in June. 15-20 hrs per week.  He is a Academic librarian. He has to twist, lift, and bend frequently. His weight limit was reduced to 30lbs by his voc rehab sponsor.  His pain is predominantly in the right low back area more than left. Bending and sitting tend to exacerbate it most.   He uses ibuprofen 400mg  twice day typically. He uses a heating pad   Pain Inventory Average Pain 6 Pain Right Now 6 My pain is constant, dull and aching  In the last 24 hours, has pain interfered with the following? General activity 7 Relation with others 3 Enjoyment of life 6 What TIME of day is your pain at its worst? evening Sleep (in general) Fair  Pain is worse with: bending, standing and some activites Pain improves with: rest, heat/ice and medication Relief from Meds: .  Mobility walk without assistance ability to climb steps?  yes do you drive?  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  no  Physicians involved in your care Any changes since last visit?  no   No family history on file. Social History   Social History  . Marital status: Single    Spouse name: N/A  . Number of children: N/A  . Years of education: N/A   Social History Main Topics  . Smoking status: Never Smoker  . Smokeless tobacco: Never Used  . Alcohol use Yes  . Drug use: Unknown  . Sexual activity: Not Asked   Other Topics Concern  . None   Social History Narrative  . None   Past Surgical History:  Procedure Laterality Date  . FEMUR FRACTURE SURGERY    . NOSE SURGERY    . WISDOM TOOTH EXTRACTION     Past Medical History:  Diagnosis Date  . Traumatic brain injury (HCC)    2009    BP 112/71   Pulse 67   SpO2 97%   Opioid Risk Score:   Fall Risk Score:  `1  Depression screen PHQ 2/9  No flowsheet data found.   Review of Systems  Constitutional: Negative.   HENT: Negative.   Eyes: Negative.   Respiratory: Negative.   Cardiovascular: Negative.   Gastrointestinal: Negative.   Endocrine: Negative.   Genitourinary: Negative.   Musculoskeletal: Negative.   Skin: Negative.   Allergic/Immunologic: Negative.   Neurological: Negative.   Hematological: Negative.   Psychiatric/Behavioral: Negative.   All other systems reviewed and are negative.      Objective:   Physical Exam  General: Alert and oriented x 3, nad HEENT:Head is normocephalic, atraumatic, PERRLA, EOMI, sclera anicteric, oral mucosa pink and moist, dentition intact, ext ear canals clear,  Neck:Supple. No jvd or lymphadenopathy  Heart:RRR Chest:normal effort Abdomen:Soft, non-tender,BS+.  Extremities:No clubbing, cyanosis, or edema. Pulses are present Skin:Clean and intact without signs of breakdown  Neuro:word finding deficits. Attention and processing at baseline Musculoskeletal:lumbar spine tender to palpation right more than left. Worse with bending. No radicular signs. Still steps down  Into right leg when walking.  Psych:Pt's affect is appropriate. Pt is cooperative and pleasant as always.    Assessment & Plan:  1. 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  3. Increased low back pain. ?likely related to his sudden moderate to heavy duty job responsibilities.    Plan:  1. Klonopin for now at same dose. REFILLED TODAY 2. Continue trazodone. 3. Belsomra 10mg  qhs has been effective. REFILLED TODAY  4. Continue efforts with [employer] 5. Continue adderall IR 7.5mg  1-2 tabs bid #75. Second rx for next month.  -can come to pick up refills in about 2 months.              6. Check  lumbar xrays.   -HEP for lumbar spine  -diclofenac 75mg  bid  -robaxin prn  -consider course of PT depending upon response to the above.  7. Follow up with me in 6 weeks. . 15minutes of face to face patient care time were spent during this visit. All questions were encouraged and answered. I am pleased with his vocational efforts.

## 2017-06-15 ENCOUNTER — Telehealth: Payer: Self-pay | Admitting: *Deleted

## 2017-06-15 NOTE — Telephone Encounter (Addendum)
Patient's Mother called and left a message.  She is asking for 2 things.  (1.) She would like a call back regarding lumbar image study results.  (2.) She says the pharmacy is asking for a prior authorization for diclofenac sodium tabs. I submitted a request through [employer].    Please advise on what to tell patient's mother regarding back x-ray

## 2017-06-16 NOTE — Telephone Encounter (Signed)
I spoke with Mrs Frank Frye and let her know about the x-ray report.  Also the Diclofenac was approved.  Confirmation #:[id number]   Prior Approval [id number]#:[id number] Effective Begin Date:06/15/2017  Effective End Date: 06/10/2018

## 2017-06-16 NOTE — Telephone Encounter (Signed)
There is very mild disc space narrowing at L4-5 but otherwise the spine looks good. Would continue with plan as outlined at his visit on Monday.  thanks

## 2017-06-23 ENCOUNTER — Encounter: Payer: Self-pay | Admitting: Physical Medicine & Rehabilitation

## 2017-06-28 ENCOUNTER — Ambulatory Visit: Payer: Medicaid Other | Admitting: Physical Medicine & Rehabilitation

## 2017-06-30 ENCOUNTER — Telehealth: Payer: Self-pay

## 2017-06-30 NOTE — Telephone Encounter (Signed)
So if i've held him out of work, why do we need work restrictions and limitations? i'm confused. Those are things we stipulate when the patient returns to work.

## 2017-06-30 NOTE — Telephone Encounter (Addendum)
Called and spoke with patients mother, Venetia [employer] is requesting that the letter that was written for him contain the following in addition to what was previously written:  During the 6 weeks that he will be out of work patient will have x rays on lower back area stretching exercises to be conducted Pain management with medication And follow up appointments on 07/26/17 and 10/06/17    That is what they are requesting for now in order to approve his leave absence

## 2017-06-30 NOTE — Telephone Encounter (Signed)
Letter written

## 2017-06-30 NOTE — Telephone Encounter (Signed)
Patients mother called requesting specific information in reguards to a letter that was wrote on the 13th of august that requested:  [employer], is requesting additional information to process his request for leave of absence.  They request "detailed information on medical needs, restrictions for weight or movement, time written out, expected follow up visits."     Could you write a letter that contains this specific information in order to process his leave of absence?

## 2017-07-01 NOTE — Telephone Encounter (Signed)
LVM notifying patient/mother letter has been printed and ready for pick up.

## 2017-07-02 NOTE — Telephone Encounter (Deleted)
VM left for patient to pick up letter

## 2017-07-26 ENCOUNTER — Encounter: Payer: Self-pay | Admitting: Physical Medicine & Rehabilitation

## 2017-07-26 ENCOUNTER — Encounter: Payer: Medicaid Other | Attending: Physical Medicine & Rehabilitation | Admitting: Physical Medicine & Rehabilitation

## 2017-07-26 DIAGNOSIS — Z79899 Other long term (current) drug therapy: Secondary | ICD-10-CM | POA: Diagnosis not present

## 2017-07-26 DIAGNOSIS — F988 Other specified behavioral and emotional disorders with onset usually occurring in childhood and adolescence: Secondary | ICD-10-CM | POA: Insufficient documentation

## 2017-07-26 DIAGNOSIS — G4700 Insomnia, unspecified: Secondary | ICD-10-CM | POA: Diagnosis not present

## 2017-07-26 DIAGNOSIS — F419 Anxiety disorder, unspecified: Secondary | ICD-10-CM | POA: Diagnosis not present

## 2017-07-26 DIAGNOSIS — S069X0S Unspecified intracranial injury without loss of consciousness, sequela: Secondary | ICD-10-CM

## 2017-07-26 DIAGNOSIS — S39012A Strain of muscle, fascia and tendon of lower back, initial encounter: Secondary | ICD-10-CM | POA: Diagnosis not present

## 2017-07-26 DIAGNOSIS — M47816 Spondylosis without myelopathy or radiculopathy, lumbar region: Secondary | ICD-10-CM | POA: Insufficient documentation

## 2017-07-26 DIAGNOSIS — M545 Low back pain, unspecified: Secondary | ICD-10-CM

## 2017-07-26 DIAGNOSIS — F0781 Postconcussional syndrome: Secondary | ICD-10-CM | POA: Diagnosis not present

## 2017-07-26 DIAGNOSIS — F068 Other specified mental disorders due to known physiological condition: Secondary | ICD-10-CM

## 2017-07-26 DIAGNOSIS — G8929 Other chronic pain: Secondary | ICD-10-CM

## 2017-07-26 MED ORDER — AMPHETAMINE-DEXTROAMPHETAMINE 7.5 MG PO TABS: 7.5000 mg | ORAL_TABLET | Freq: Two times a day (BID) | ORAL | 0 refills | Status: DC

## 2017-07-26 NOTE — Progress Notes (Signed)
Subjective:    Patient ID: Frank Frye, male    DOB: Jan 06, 1986, 31 y.o.   MRN: [id number]  HPI  Frank Frye is here in follow up of her TBI and back pain. He is feeling better but is still having pain in his low back. He is performing the back exercises I gave him last time which make him feel a little bit better. He rated his pain a 5/10 but states it's more like a 3/10. He is not sure what aggravates the pain. The voltaren may help. He has been out of work since I last saw him.    I reviewed his lumbar xrays today at length (with mom too):   The lumbar vertebral bodies are preserved in height. Very mild disc space narrowing at L4-5 is present. There is no spondylolisthesis or significant facet joint hypertrophy. The pedicles and transverse processes are intact. There is spina bifida occulta at S1.   Pain Inventory Average Pain 5 Pain Right Now 6 My pain is aching  In the last 24 hours, has pain interfered with the following? General activity 4 Relation with others 2 Enjoyment of life 5 What TIME of day is your pain at its worst? . Sleep (in general) Good  Pain is worse with: bending Pain improves with: rest and heat/ice Relief from Meds: 7  Mobility walk without assistance  Function employed # of hrs/week 15-20  Neuro/Psych No problems in this area  Prior Studies Any changes since last visit?  no  Physicians involved in your care Any changes since last visit?  no   No family history on file. Social History   Social History  . Marital status: Single    Spouse name: N/A  . Number of children: N/A  . Years of education: N/A   Social History Main Topics  . Smoking status: Never Smoker  . Smokeless tobacco: Never Used  . Alcohol use Yes  . Drug use: Unknown  . Sexual activity: Not Asked   Other Topics Concern  . None   Social History Narrative  . None   Past Surgical History:  Procedure Laterality Date  . FEMUR FRACTURE SURGERY    . NOSE  SURGERY    . WISDOM TOOTH EXTRACTION     Past Medical History:  Diagnosis Date  . Traumatic brain injury (HCC)    2009   BP 117/76   Pulse 60   SpO2 98%   Opioid Risk Score:   Fall Risk Score:  `1  Depression screen PHQ 2/9  No flowsheet data found.   Review of Systems  Constitutional: Negative.   HENT: Negative.   Eyes: Negative.   Respiratory: Negative.   Cardiovascular: Negative.   Gastrointestinal: Negative.   Endocrine: Negative.   Genitourinary: Negative.   Musculoskeletal: Negative.   Skin: Negative.   Allergic/Immunologic: Negative.   Neurological: Negative.   Hematological: Negative.   Psychiatric/Behavioral: Negative.   All other systems reviewed and are negative.      Objective:   Physical Exam  General: Alert and oriented x 3, nad HEENT:Head is normocephalic, atraumatic, PERRLA, EOMI, sclera anicteric, oral mucosa pink and moist, dentition intact, ext ear canals clear,  Neck:Supple. No jvd or lymphadenopathy  Heart:RRR Chest: normal effort Abdomen:Soft, non-tender,BS+.  Extremities:No clubbing, cyanosis, or edema. Pulses are present Skin:Clean and intact without signs of breakdown  Neuro:word finding deficits. Attention and processing at baseline. Strength and sensory are well preserved.  Musculoskeletal:patient has mild pain to palpation of lower lumbar  paraspinals right more than left. He is able to bend with minimal pain today while extension causes discomfort. Hamstrings are tight. Facet maneuvers were equivocal. SLR and SST was negative.  Pelvic symmetry is reasonable.  Psych:Pt's affect is appropriate. Pt is cooperative and pleasant as always.    Assessment & Plan:  1. 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  3. 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 Symptoms have generally improved since last visit   Plan:  1. Klonopin for now at  same dose.   2. Continue trazodone. 3. Belsomra  qhs has been effective. REFILLED TODAY 4. Continue efforts with VR 5. Continue adderall IR 7.5mg  1-2 tabsbid #75. Refilled today.  -    6. Made a referral to [facility] for PT to address lumbar and core ROM/strength/lengthening, modalities, HEP             -continue diclofenac  bid             -robaxin prn             -consider MRI if he fails to improve further  -out of work note was written.  7. Follow up with me in 4 weeks. . of face to face patient care time were spent during this visit. All questions were encouraged and answered.

## 2017-07-26 NOTE — Patient Instructions (Signed)
YOU NEED TO WORK ON YOUR BACK AND LOWER BODY STRETCHES DAILY   PLEASE FEEL FREE TO CALL OUR OFFICE WITH ANY PROBLEMS OR QUESTIONS [phone])

## 2017-08-03 ENCOUNTER — Ambulatory Visit: Payer: Medicaid Other | Attending: Physical Medicine & Rehabilitation | Admitting: Physical Therapy

## 2017-08-03 ENCOUNTER — Encounter: Payer: Self-pay | Admitting: Physical Therapy

## 2017-08-03 DIAGNOSIS — M545 Low back pain, unspecified: Secondary | ICD-10-CM

## 2017-08-03 DIAGNOSIS — M6281 Muscle weakness (generalized): Secondary | ICD-10-CM | POA: Diagnosis not present

## 2017-08-03 NOTE — Patient Instructions (Addendum)
Posture - Sitting    Sit upright, head facing forward. Try using a roll to support lower back. Keep shoulders relaxed, and avoid rounded back. Keep hips level with knees. Avoid crossing legs for long periods. Try using a pillow  Copyright  VHI. All rights reserved.  Backward Bend (Standing)    Arch backward to make hollow of back deeper. Hold __1__ seconds. Repeat _3___ times per set. After sitting for awhile.  http://orth.exer.us/178   Copyright  VHI. All rights reserved.  Computer Work    Position work to Art gallery manager. Use proper work and seat height. Keep shoulders back and down, wrists straight, and elbows at right angles. Use chair that provides full back support. Add footrest and lumbar roll as needed.   Copyright  VHI. All rights reserved.  [facility] [address] [place], [place] [address] Phone # [phone] Fax [phone]

## 2017-08-03 NOTE — Therapy (Signed)
[facility] [address], [address] [place], [place], [address] Phone: [phone]   Fax:  [phone]  Physical Therapy Evaluation  Patient Details  Name: Frank Frye MRN: [id number] Date of Birth: 22-Nov-1985 Referring Provider: Dr. Faith Rogue  Encounter Date: 08/03/2017      PT End of Session - 08/03/17 1408    Visit Number 1   Date for PT Re-Evaluation 08/31/17   Authorization Type Medicaid   Authorization - Visit Number 1   Authorization - Number of Visits 4   PT Start Time 1402   PT Stop Time 1445   PT Time Calculation (min) 43 min   Activity Tolerance Patient tolerated treatment well   Behavior During Therapy [facility] for tasks assessed/performed      Past Medical History:  Diagnosis Date  . Traumatic brain injury [facility])    2009    Past Surgical History:  Procedure Laterality Date  . FEMUR FRACTURE SURGERY    . NOSE SURGERY    . WISDOM TOOTH EXTRACTION      There were no vitals filed for this visit.       Subjective Assessment - 08/03/17 1421    Subjective Back pain suddenly in the summer and has been out of work for the past 8 weeks.  Patient is working with vocational rehab to get a job.  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 Yes   Pain Score 2   high is 7/10   Pain Location Back   Pain Orientation Lower   Pain Descriptors / Indicators Dull   Pain Type Acute pain   Pain Onset More than a month ago   Pain Frequency Intermittent   Aggravating Factors  sitting at computer playing games; lifting   Pain Relieving Factors laying in bed, sleeping, not in morning   Multiple Pain Sites No            [facility] PT Assessment - 08/03/17 0001      Assessment   Medical Diagnosis  M47.816 of lumbar region withou tmyelopathy or radiculopathy   Referring Provider Dr. Faith Rogue   Onset Date/Surgical Date 06/08/17   Prior Therapy none     Precautions   Precautions Back   Precaution Comments no lifitng over 30#     Restrictions   Weight Bearing Restrictions No     Balance Screen   Has the patient fallen in the past 6 months No   Has the patient had a decrease in activity level because of a fear of falling?  No   Is the patient reluctant to leave their home because of a fear of falling?  No     Home Tourist information centre manager residence     Prior Function   Level of Independence Independent   Vocation Workers comp   Conservator, museum/gallery and rotate   Leisure ride bike     Cognition   Overall Cognitive Status Impaired/Different from baseline  TBI     Observation/Other Assessments   Observations mom helps answer questions   Focus on Therapeutic Outcomes (FOTO)  FOTO score is 41% limitaiton  goal is 31% limitation     Posture/Postural Control  Posture/Postural Control No significant limitations     ROM / Strength   AROM / PROM / Strength AROM;PROM;Strength     AROM   Lumbar Extension decreased by 50%   Lumbar - Right Side Bend decreased by 25%   Lumbar - Left Side Bend decreased by 25%     Palpation   Spinal mobility decreased movement of L1-L5   Palpation comment tenderness on left side of L1            Objective measurements completed on examination: See above findings.                  PT Education - 08/03/17 1453    Education provided Yes   Education Details lumbar extension in standing after sitting; sitting at computer;    Person(s) Educated Patient;Parent(s)  mother   Methods Explanation;Handout;Demonstration   Comprehension Returned demonstration;Verbalized understanding             PT Long Term Goals - 08/03/17 1506      PT LONG TERM GOAL #1   Title independent with HEP and has  handouts to progress himself   Baseline not educated yet   Time 4   Period Weeks   Status New   Target Date 08/31/17     PT LONG TERM GOAL #2   Title ability to sit with pain level </= 1/10 due to improve lumbar mobility   Baseline pain level is 7/10; lumbar extension decreased by 50%   Time 4   Period Weeks   Status New   Target Date 08/31/17     PT LONG TERM GOAL #3   Title lift items < 30# with correct body mechanics due to increase strength of core and pain level </= 1/10   Baseline pain level 7/10; not able to lift at this time   Time 4   Period Weeks   Status New   Target Date 09/28/17                Plan - 08/03/17 1407    Clinical Impression Statement Patient is a 31 year old male with lumbar pain that started 8 weeks ago.  Patient reports he started a job at [employer] that required him to lift 1000 packages in the 4 hours he worked.  Patient was placed on lifting restriction of 30#.  Patient has decreased lumbar mobility  for extension and bilateral sidebending.  L1-L5 decreased mobility.  Tenderness located on left side of L1.  Back extensor strength is 2/5.  Patient has been out of work for 8 weeks due to his back pain.  Patient reports intermittent dull lumbar pain between level 2-7/10.  Patient is status post TBI 2009 and Polytrauma. Patient requires pictures for HEP so he is able to remember.  Patient mother was present for the evaluation due to patient having difficulty with remembering. Patient will benefit from skilled therapy for manual skills to improve spinal mobility and reduction in pain and strengthening of the core.     History and Personal Factors relevant to plan of care: TBI 2009; polytrauma; post concussion syndrome; patient has difficulty with memory   Clinical Presentation Evolving   Clinical Presentation due to: pain is preventing patient from working; patient has difficulty with memory   Clinical Decision Making Moderate   Rehab Potential Good    Clinical Impairments Affecting Rehab Potential TBI 2009; polytrauma   PT Frequency 1x / week   PT Duration 4 weeks   PT  Treatment/Interventions Cryotherapy;Electrical Stimulation;Moist Heat;Ultrasound;Therapeutic activities;Therapeutic exercise;Neuromuscular re-education;Patient/family education;Manual techniques   PT Next Visit Plan joint mobilization to lumbar; core strengthening; modalities as needed; body mechanics with lifting and daily activities   PT Home Exercise Plan progress as needed   Consulted and Agree with Plan of Care Patient  mother      Patient will benefit from skilled therapeutic intervention in order to improve the following deficits and impairments:  Pain, Decreased strength, Decreased mobility, Increased fascial restricitons, Decreased activity tolerance, Impaired flexibility, Improper body mechanics  Visit Diagnosis: Muscle weakness (generalized) - Plan: PT plan of care cert/re-cert  Acute left-sided low back pain without sciatica - Plan: PT plan of care cert/re-cert     Problem List Patient Active Problem List   Diagnosis Date Noted  . Spondylosis of lumbar region without myelopathy or radiculopathy 07/26/2017  . Low back pain 11/09/2016  . Postconcussion syndrome 03/03/2013  . Cognitive deficit as late effect of traumatic brain injury (HCC) 02/03/2013  . Insomnia 02/03/2013    Eulis Foster, PT 08/03/17 3:14 PM   [facility] [address] [place], [place], [address] Phone: [phone]   Fax:  [phone]  Name: Frank Frye MRN: [id number] Date of Birth: [birth year]

## 2017-08-04 ENCOUNTER — Ambulatory Visit: Payer: Medicaid Other | Admitting: Physical Therapy

## 2017-08-11 ENCOUNTER — Ambulatory Visit: Payer: Medicaid Other | Admitting: Physical Therapy

## 2017-08-11 ENCOUNTER — Encounter: Payer: Self-pay | Admitting: Physical Therapy

## 2017-08-11 DIAGNOSIS — M6281 Muscle weakness (generalized): Secondary | ICD-10-CM

## 2017-08-11 DIAGNOSIS — M545 Low back pain, unspecified: Secondary | ICD-10-CM

## 2017-08-11 NOTE — Therapy (Signed)
[facility] [address] [place], [place], [address] Phone: [phone]   Fax:  [phone]  Physical Therapy Treatment  Patient Details  Name: Frank Frye MRN: [id number] Date of Birth: 07/11/1986 Referring Provider: Dr. Alger Simons  Encounter Date: 08/11/2017      PT End of Session - 08/11/17 1232    Visit Number 2   Date for PT Re-Evaluation 08/31/17   Authorization Type Medicaid   Authorization - Visit Number 2   Authorization - Number of Visits 4   PT Start Time 8250   PT Stop Time 1226   PT Time Calculation (min) 41 min   Activity Tolerance Patient tolerated treatment well   Behavior During Therapy [facility] for tasks assessed/performed      Past Medical History:  Diagnosis Date  . Traumatic brain injury [facility])    2009    Past Surgical History:  Procedure Laterality Date  . FEMUR FRACTURE SURGERY    . NOSE SURGERY    . WISDOM TOOTH EXTRACTION      There were no vitals filed for this visit.      Subjective Assessment - 08/11/17 1152    Subjective My back feels the same.    Patient is accompained by: Family member  mom   Pertinent History TBI 2009   Limitations Lifting   Patient Stated Goals reduce pain   Currently in Pain? Yes   Pain Score 2    Pain Location Back   Pain Orientation Lower   Pain Descriptors / Indicators Dull   Pain Type Acute pain   Pain Onset More than a month ago   Aggravating Factors  sitting at computer playing game; lifting   Pain Relieving Factors laying on bed                         [facility] Adult PT Treatment/Exercise - 08/11/17 0001      Therapeutic Activites    Therapeutic Activities Other Therapeutic Activities   Other Therapeutic Activities discussed how to adjust his computer chair to accommodate with the arm rest being higher than the mouse, ways to elevate the desk     Exercises   Exercises Lumbar     Lumbar Exercises: Stretches    Double Knee to Chest Stretch 2 reps;30 seconds   Lower Trunk Rotation 4 reps;30 seconds  2 times each side   Prone on Elbows Stretch 2 reps;30 seconds   Press Ups 5 reps     Lumbar Exercises: Aerobic   Stationary Bike level 1 7 min     Manual Therapy   Manual Therapy Joint mobilization   Joint Mobilization PA mobilization  to T10-L5 grade 3                PT Education - 08/11/17 1229    Education provided Yes   Education Details lumbar stretches; discussed computer chair; back strength   Person(s) Educated Patient;Parent(s)  mom   Methods Explanation;Tactile cues;Verbal cues   Comprehension Verbalized understanding;Returned demonstration             PT Long Term Goals - 08/11/17 1243      PT LONG TERM GOAL #1   Title independent with HEP and has handouts to progress himself   Baseline not educated yet   Time 4   Period Weeks   Status On-going     PT LONG TERM GOAL #2   Title ability to sit  with pain level </= 1/10 due to improve lumbar mobility   Baseline pain level is 7/10; lumbar extension decreased by 50%   Time 4   Period Weeks   Status On-going     PT LONG TERM GOAL #3   Title lift items < 30# with correct body mechanics due to increase strength of core and pain level </= 1/10   Baseline pain level 7/10; not able to lift at this time   Time 4   Period Weeks   Status On-going               Plan - 08/11/17 1233    Clinical Impression Statement Patient is able to perfrom the exercises correctly.  Patient understands ways to adjust his computer station to decrease strain on his back.  Patient has not met goals yet due to just having started therapy.  Patient will benefit from skilled  for manual skills to improve spinal mobility and reduction in pain and strengthening of the core.    Rehab Potential Good   Clinical Impairments Affecting Rehab Potential TBI 2009; polytrauma   PT Frequency 1x / week   PT Duration 4 weeks   PT  Treatment/Interventions Cryotherapy;Electrical Stimulation;Moist Heat;Ultrasound;Therapeutic activities;Therapeutic exercise;Neuromuscular re-education;Patient/family education;Manual techniques   PT Next Visit Plan joint mobilization to lumbar; core strengthening; modalities as needed; body mechanics with lifting and daily activities   PT Home Exercise Plan progress as needed   Consulted and Agree with Plan of Care Patient      Patient will benefit from skilled therapeutic intervention in order to improve the following deficits and impairments:  Pain, Decreased strength, Decreased mobility, Increased fascial restricitons, Decreased activity tolerance, Impaired flexibility, Improper body mechanics  Visit Diagnosis: Muscle weakness (generalized)  Acute left-sided low back pain without sciatica     Problem List Patient Active Problem List   Diagnosis Date Noted  . Spondylosis of lumbar region without myelopathy or radiculopathy 07/26/2017  . Low back pain 11/09/2016  . Postconcussion syndrome 03/03/2013  . Cognitive deficit as late effect of traumatic brain injury (HCC) 02/03/2013  . Insomnia 02/03/2013     , PT 08/11/17 12:46 PM   [facility] [address], [address] [place], [place], [address] Phone: [phone]   Fax:  [phone]  Name: [name] MRN: [id number] Date of Birth: 11/02/1985   

## 2017-08-11 NOTE — Patient Instructions (Addendum)
Knee-to-Chest Stretch: Bilateral    With hands behind knees, pull both knees in to chest until a comfortable stretch is felt in lower back and buttocks. Keep back relaxed. Hold _30___ seconds. Repeat __2__ times per set. Do _1___ sets per session. Do ____ sessions per day.  http://orth.exer.us/128   Copyright  VHI. All rights reserved.  Bridging    Slowly raise buttocks from floor, keeping stomach tight. Repeat _15___ times per set. Do __1__ sets per session. Do _1___ sessions per day.  http://orth.exer.us/1096   Copyright  VHI. All rights reserved.  Arm / Leg Lift: Opposite (Prone)    Lift right leg and opposite arm __2__ inches from floor, keeping knee locked. Hold 2 sec.  Repeat _15___ times per set. Do ___1_ sets per session. Do __1__ sessions per day.  http://orth.exer.us/100   Copyright  VHI. All rights reserved.  [facility] [address], [address] [place], [place] [address] Phone # [phone] Fax [phone]

## 2017-08-20 ENCOUNTER — Ambulatory Visit: Payer: Medicaid Other | Admitting: Physical Therapy

## 2017-08-20 ENCOUNTER — Encounter: Payer: Self-pay | Admitting: Physical Therapy

## 2017-08-20 DIAGNOSIS — M545 Low back pain, unspecified: Secondary | ICD-10-CM

## 2017-08-20 DIAGNOSIS — M6281 Muscle weakness (generalized): Secondary | ICD-10-CM

## 2017-08-20 NOTE — Therapy (Signed)
[facility] [address], [address] [place], [place], [address] Phone: [phone]   Fax:  [phone]  Physical Therapy Treatment  Patient Details  Name: Frank Frye MRN: [id number] Date of Birth: 10-21-1986 Referring Provider: Dr. Faith RogueZachary Swartz  Encounter Date: 08/20/2017      PT End of Session - 08/20/17 1058    Visit Number 3   Date for PT Re-Evaluation 08/31/17   Authorization Type Medicaid   Authorization - Visit Number 3   Authorization - Number of Visits 4   PT Start Time 1057   PT Stop Time 1140   PT Time Calculation (min) 43 min   Activity Tolerance Patient tolerated treatment well   Behavior During Therapy [facility] for tasks assessed/performed      Past Medical History:  Diagnosis Date  . 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 FEMUR FRACTURE SURGERY    . NOSE SURGERY    . WISDOM TOOTH EXTRACTION      There were no vitals filed for this visit.      Subjective Assessment - 08/20/17 1103    Subjective My back is better since doing the exercises. I tried a pillow behind my back at the computer but this did not help too much.  Reports he is not working right now so he is not lifting.   Currently in Pain? No/denies   Multiple Pain Sites No                         [facility] Adult PT Treatment/Exercise - 08/20/17 0001      Lumbar Exercises: Stretches   Single Knee to Chest Stretch 2 reps;30 seconds  Bil   Double Knee to Chest Stretch 2 reps;30 seconds   Lower Trunk Rotation --  On red ball wiht PTA asst 20x    Press Ups 5 reps     Lumbar Exercises: Aerobic   Stationary Bike L1 x 10 min.   Concurrent review of stauts with pt.    UBE (Upper Arm Bike) Sitting on blue ball for posture/core 4 min L1  To simulate sitting at computer     Lumbar Exercises: Supine   Bridge 20 reps   Other Supine Lumbar Exercises Red band horizontal  abd with focus on core contraction 10x   Other Supine Lumbar Exercises Yellow ball supine chops/diagonals 10x bil                     PT Long Term Goals - 08/11/17 1243      PT LONG TERM GOAL #1   Title independent with HEP and has handouts to progress himself   Baseline not educated yet   Time 4   Period Weeks   Status On-going     PT LONG TERM GOAL #2   Title ability to sit with pain level </= 1/10 due to improve lumbar mobility   Baseline pain level is 7/10; lumbar extension decreased by 50%   Time 4   Period Weeks   Status On-going     PT LONG TERM GOAL #3   Title lift items < 30# with correct body mechanics due to increase strength of core and pain level </= 1/10   Baseline pain level 7/10; not able to lift at this time   Time 4   Period Weeks   Status On-going  Plan - 08/20/17 1101    Clinical Impression Statement Pt reports he feels better doing his home stretching. Sitting in his computer chair still bothers him despite trrying a pillow behind him. He currently reports not working so he does not have to lift. Concentrated on core stabillization exs today. Pt moves slowly with his exercises.    Rehab Potential Good   Clinical Impairments Affecting Rehab Potential TBI 2009; polytrauma   PT Frequency 1x / week   PT Duration 4 weeks   PT Treatment/Interventions Cryotherapy;Electrical Stimulation;Moist Heat;Ultrasound;Therapeutic activities;Therapeutic exercise;Neuromuscular re-education;Patient/family education;Manual techniques   Consulted and Agree with Plan of Care Patient      Patient will benefit from skilled therapeutic intervention in order to improve the following deficits and impairments:  Pain, Decreased strength, Decreased mobility, Increased fascial restricitons, Decreased activity tolerance, Impaired flexibility, Improper body mechanics  Visit Diagnosis: Muscle weakness (generalized)  Acute left-sided low back pain  without sciatica     Problem List Patient Active Problem List   Diagnosis Date Noted  . Spondylosis of lumbar region without myelopathy or radiculopathy 07/26/2017  . Low back pain 11/09/2016  . Postconcussion syndrome 03/03/2013  . Cognitive deficit as late effect of traumatic brain injury (HCC) 02/03/2013  . Insomnia 02/03/2013    Fendi Meinhardt, PTA 08/20/2017, 11:37 AM  [facility] [address], [address] [place], [place], [address] Phone: [phone]   Fax:  [phone]  Name: Frank Frye MRN: [id number] Date of Birth: July 14, 1986

## 2017-08-26 ENCOUNTER — Ambulatory Visit: Payer: Medicaid Other | Admitting: Physical Therapy

## 2017-08-26 ENCOUNTER — Encounter: Payer: Self-pay | Admitting: Physical Therapy

## 2017-08-26 DIAGNOSIS — M545 Low back pain, unspecified: Secondary | ICD-10-CM

## 2017-08-26 DIAGNOSIS — M6281 Muscle weakness (generalized): Secondary | ICD-10-CM | POA: Diagnosis not present

## 2017-08-26 NOTE — Therapy (Signed)
[facility] [address] [place], [place], [address] Phone: [phone]   Fax:  [phone]  Physical Therapy Treatment  Patient Details  Name: Frank Frye MRN: [id number] Date of Birth: 04-16-1986 Referring Provider: Dr. Faith Rogue  Encounter Date: 08/26/2017      PT End of Session - 08/26/17 1448    Visit Number 4   Date for PT Re-Evaluation 09/28/17   Authorization Type Medicaid   Authorization - Visit Number 4   PT Start Time 1400   PT Stop Time 1443   PT Time Calculation (min) 43 min   Activity Tolerance Patient tolerated treatment well   Behavior During Therapy [facility] for tasks assessed/performed      Past Medical History:  Diagnosis Date  . Traumatic brain injury [facility])    2009    Past Surgical History:  Procedure Laterality Date  . FEMUR FRACTURE SURGERY    . NOSE SURGERY    . WISDOM TOOTH EXTRACTION      There were no vitals filed for this visit.      Subjective Assessment - 08/26/17 1401    Subjective I feel better since the evaluation. Patient reports minor pain daily at level 2/10.   Patient is accompained by: Family member  mon   Pertinent History TBI 2009   Limitations Lifting   Patient Stated Goals reduce pain   Currently in Pain? Yes   Pain Score 2    Pain Location Back   Pain Orientation Lower   Pain Descriptors / Indicators Dull   Pain Type Acute pain   Pain Onset More than a month ago   Pain Frequency Intermittent   Aggravating Factors  sitting at computer to play video games; bending over;    Pain Relieving Factors laying on bed   Multiple Pain Sites No            [facility] PT Assessment - 08/26/17 0001      Assessment   Medical Diagnosis M47.816 of lumbar region withou tmyelopathy or radiculopathy   Referring Provider Dr. Faith Rogue   Onset Date/Surgical Date 06/08/17   Prior Therapy none     Precautions   Precautions Back   Precaution Comments no  lifitng over 30#     Restrictions   Weight Bearing Restrictions No     Balance Screen   Has the patient fallen in the past 6 months No   Has the patient had a decrease in activity level because of a fear of falling?  No   Is the patient reluctant to leave their home because of a fear of falling?  No     Home Tourist information centre manager residence     Prior Function   Level of Independence Independent   Vocation Workers comp   Conservator, museum/gallery and rotate   Leisure ride bike     Cognition   Overall Cognitive Status Impaired/Different from baseline  TBI     Observation/Other Assessments   Observations mom helps answer questions   Focus on Therapeutic Outcomes (FOTO)  FOTO score is 41% limitaiton  goal is 31% limitation     Posture/Postural Control   Posture/Postural Control Postural limitations   Posture Comments flat lumbar spine     ROM / Strength   AROM / PROM / Strength AROM;PROM;Strength     AROM   Lumbar Extension decreased by 50%   Lumbar - Right Side Bend decreased by 25%  Lumbar - Left Side Bend decreased by 25%     Strength   Overall Strength Comments bil. hip flexion 4/5; bil. quads 4/5     Palpation   Spinal mobility decreased movement of L1-L5                     [facility] Adult PT Treatment/Exercise - 08/26/17 0001      Therapeutic Activites    Therapeutic Activities Other Therapeutic Activities   Other Therapeutic Activities dicussion on sitting at computer chair with no sitting past 45 minutes     Neuro Re-ed    Neuro Re-ed Details  lifting 10# 5 times ; 20# 5 times and 30# 5 times with good mechanics and discomfrot in knees.      Lumbar Exercises: Prone   Other Prone Lumbar Exercises lift both arms and legs 10 times ; 3 times hold 30 seconds     Manual Therapy   Manual Therapy Joint mobilization   Joint Mobilization p-a and rotational mobilization to T10-L5 in prone                PT Education -  08/26/17 1447    Education provided Yes   Education Details bil. UE and LE extension in prone and standing trunk extension   Person(s) Educated Patient;Parent(s)  mom   Methods Explanation;Verbal cues;Handout;Demonstration   Comprehension Verbalized understanding;Returned demonstration             PT Long Term Goals - 08/26/17 1410      PT LONG TERM GOAL #1   Title independent with HEP and has handouts to progress himself   Baseline not educated yet   Time 4   Period Weeks   Status On-going     PT LONG TERM GOAL #2   Title ability to sit with pain level </= 1/10 due to improve lumbar mobility   Baseline pain level is 7/10; lumbar extension decreased by 50%   Time 4   Period Weeks   Status On-going     PT LONG TERM GOAL #3   Title lift items < 30# with correct body mechanics due to increase strength of core and pain level </= 1/10   Baseline pain level 7/10; not able to lift at this time   Time 4   Period Weeks   Status On-going               Plan - 08/26/17 1448    Clinical Impression Statement Patient has minimal pain in lumbar with sitting and lifting. Patient has pain with sitting at the computer playing memory games for several hours. Therapist advised patient to sit for 45  minutes then stand up.  Patient has limited lumbar extension by 50% and bilateral lumbar sidebending decreased by 25%.  Patient has decreased lumbar extensor strength and bilateral hip flexion and knee extension making it difficult to lift items from the floor.  Patient has decreased mobility of T10-L5.  Patient will benfit from skilled therapy to reduce pain, improve core strength to be able to lift items and sit with reduction in pain.    Rehab Potential Good   Clinical Impairments Affecting Rehab Potential TBI 2009; polytrauma   PT Frequency 1x / week   PT Duration 4 weeks   PT Treatment/Interventions Cryotherapy;Electrical Stimulation;Moist Heat;Ultrasound;Therapeutic  activities;Therapeutic exercise;Neuromuscular re-education;Patient/family education;Manual techniques   PT Next Visit Plan joint mobilization to lumbar; core strengthening;  body mechanics with lifting and daily activities   PT Home  Exercise Plan progress as needed   Recommended Other Services sent second notice for initial eval to be signed, sent renewal note   Consulted and Agree with Plan of Care Patient      Patient will benefit from skilled therapeutic intervention in order to improve the following deficits and impairments:  Pain, Decreased strength, Decreased mobility, Increased fascial restricitons, Decreased activity tolerance, Impaired flexibility, Improper body mechanics  Visit Diagnosis: Muscle weakness (generalized)  Acute left-sided low back pain without sciatica     Problem List Patient Active Problem List   Diagnosis Date Noted  . Spondylosis of lumbar region without myelopathy or radiculopathy 07/26/2017  . Low back pain 11/09/2016  . Postconcussion syndrome 03/03/2013  . Cognitive deficit as late effect of traumatic brain injury (HCC) 02/03/2013  . Insomnia 02/03/2013    Eulis Foster, PT 08/26/17 2:59 PM   [facility] [address] [place], [place], [address] Phone: [phone]   Fax:  [phone]  Name: Frank Frye MRN: [id number] Date of Birth: 16-May-1986

## 2017-08-26 NOTE — Patient Instructions (Addendum)
Ride bike  3 times per week and walk for 45 min 3 times per week  Extension (Prone)    Lift upper body and legs from floor. Do not arch neck. Repeat _10___ times per set. Do __1__ sets per session. Do __1__ sessions per day.  http://orth.exer.us/112   Copyright  VHI. All rights reserved.   Backward Bend (Standing)    Arch backward to make hollow of back deeper. Hold ____ seconds. Repeat _1___ times per set. Do __1__ sets per session. Do __5__ sessions per day.  http://orth.exer.us/178   Copyright  VHI. All rights reserved.  [facility] [address], [address] [place], [place] [address] Phone # [phone] Fax [phone]

## 2017-08-30 ENCOUNTER — Encounter: Payer: Self-pay | Admitting: Physical Medicine & Rehabilitation

## 2017-08-30 ENCOUNTER — Encounter: Payer: Medicaid Other | Attending: Physical Medicine & Rehabilitation | Admitting: Physical Medicine & Rehabilitation

## 2017-08-30 DIAGNOSIS — M545 Low back pain, unspecified: Secondary | ICD-10-CM

## 2017-08-30 DIAGNOSIS — S39012A Strain of muscle, fascia and tendon of lower back, initial encounter: Secondary | ICD-10-CM | POA: Diagnosis not present

## 2017-08-30 DIAGNOSIS — G4700 Insomnia, unspecified: Secondary | ICD-10-CM | POA: Diagnosis not present

## 2017-08-30 DIAGNOSIS — F419 Anxiety disorder, unspecified: Secondary | ICD-10-CM | POA: Diagnosis not present

## 2017-08-30 DIAGNOSIS — F0781 Postconcussional syndrome: Secondary | ICD-10-CM

## 2017-08-30 DIAGNOSIS — F988 Other specified behavioral and emotional disorders with onset usually occurring in childhood and adolescence: Secondary | ICD-10-CM | POA: Diagnosis not present

## 2017-08-30 DIAGNOSIS — S069X0S Unspecified intracranial injury without loss of consciousness, sequela: Secondary | ICD-10-CM | POA: Diagnosis not present

## 2017-08-30 DIAGNOSIS — Z79899 Other long term (current) drug therapy: Secondary | ICD-10-CM | POA: Diagnosis not present

## 2017-08-30 DIAGNOSIS — G8929 Other chronic pain: Secondary | ICD-10-CM | POA: Diagnosis not present

## 2017-08-30 DIAGNOSIS — F068 Other specified mental disorders due to known physiological condition: Secondary | ICD-10-CM

## 2017-08-30 MED ORDER — AMPHETAMINE-DEXTROAMPHETAMINE 7.5 MG PO TABS: 7.5000 mg | ORAL_TABLET | Freq: Two times a day (BID) | ORAL | 0 refills | Status: DC

## 2017-08-30 NOTE — Patient Instructions (Signed)
PLEASE FEEL FREE TO CALL OUR OFFICE WITH ANY PROBLEMS OR QUESTIONS ([phone])      

## 2017-08-30 NOTE — Progress Notes (Signed)
Subjective:    Patient ID: Frank Frye, male    DOB: Jan 26, 1986, 31 y.o.   MRN: [id number]  HPI Frank Frye is here in follow up of his TBI and new onset low back pain. He has responded nicely to PT and is doing stretching at home now. He does hurt a little more once he's more active. He has a few visits left with PT at [facility]. He is only using the diclofenac prn now  He remains out of work, but realizes that the work load is probably too much for him. He thinks that there may be a few lighter duty jobs available for him to work.   Sleep remains reasonable.   Pain Inventory Average Pain 1 Pain Right Now 1 My pain is constant  In the last 24 hours, has pain interfered with the following? General activity 3 Relation with others 1 Enjoyment of life 0 What TIME of day is your pain at its worst? evening Sleep (in general) Good  Pain is worse with: unsure Pain improves with: unsure Relief from Meds: 0  Mobility walk without assistance  Function employed # of hrs/week 0  Neuro/Psych confusion  Prior Studies Any changes since last visit?  no  Physicians involved in your care Any changes since last visit?  no   History reviewed. No pertinent family history. Social History   Social History  . Marital status: Single    Spouse name: N/A  . Number of children: N/A  . Years of education: N/A   Social History Main Topics  . Smoking status: Never Smoker  . Smokeless tobacco: Never Used  . Alcohol use Yes  . Drug use: Unknown  . Sexual activity: Not Asked   Other Topics Concern  . None   Social History Narrative  . None   Past Surgical History:  Procedure Laterality Date  . FEMUR FRACTURE SURGERY    . NOSE SURGERY    . WISDOM TOOTH EXTRACTION     Past Medical History:  Diagnosis Date  . Traumatic brain injury (HCC)    2009   BP 128/77 (BP Location: Left Arm, Patient Position: Sitting, Cuff Size: Normal)   Pulse 73   Resp 14   SpO2 97%   Opioid  Risk Score:   Fall Risk Score:  `1  Depression screen PHQ 2/9  No flowsheet data found.  Review of Systems  Constitutional: Negative.   HENT: Negative.   Eyes: Negative.   Respiratory: Negative.   Cardiovascular: Negative.   Gastrointestinal: Negative.   Endocrine: Negative.   Genitourinary: Negative.   Musculoskeletal: Positive for back pain.  Skin: Negative.   Allergic/Immunologic: Negative.   Neurological: Negative.   Hematological: Negative.   Psychiatric/Behavioral: Positive for confusion and decreased concentration.       Objective:   Physical Exam General: Alert and oriented x 3, nad HEENT:Head is normocephalic, atraumatic, PERRLA, EOMI, sclera anicteric, oral mucosa pink and moist, dentition intact, ext ear canals clear,  Neck:Supple. No jvd or lymphadenopathy  Heart:RRR Chest:  normal effort Abdomen:Soft, non-tender,BS+.  Extremities:No clubbing, cyanosis, or edema. Pulses are present Skin:Clean and intact without signs of breakdown  Neuro:word finding deficits. Attention and processing at baseline. Strength and sensory are well preserved.  Musculoskeletal: improved lumbar rom. Hamstrings more flexible. Still rotated slightly to the left. Facet maneuvers were equivocal. SLR and SST was negative.   Marland Kitchen  Psych:Pt's affect is appropriate. Pt is cooperative and pleasant as always.    Assessment & Plan:  1. 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  3. 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 Symptoms have improved tremendously with therapy and rest from work.   Plan:  1. Klonopin for now at night 2. Continue trazodone. 3. Belsomra 10mg  qhs has been effective.   5. Continue adderall IR 7.5mg  1-2 tabsbid #75. Refilled today with second for next month.  6. Continue therapies [facility] for PT to address lumbar and core ROM/strength/lengthening, modalities. Move  towards HEP -continue diclofenac 75mg  bid on a prn schedule -robaxin prn 7. I wrote Frank BoatmanBrad a letter for his employer stipulating that he should be on permanent light physical duty. He will ulimately have to work them or VR regarding finding a job which is suitable for him given his cogniitve and physical restrictions.   Follow up with me in 4 months.  15minutes of face to face patient care time were spent during this visit. All questions were encouraged and answered.

## 2017-09-02 ENCOUNTER — Ambulatory Visit: Payer: Medicaid Other | Attending: Physical Medicine & Rehabilitation | Admitting: Physical Therapy

## 2017-09-02 ENCOUNTER — Encounter: Payer: Self-pay | Admitting: Physical Therapy

## 2017-09-02 DIAGNOSIS — M6281 Muscle weakness (generalized): Secondary | ICD-10-CM | POA: Diagnosis present

## 2017-09-02 DIAGNOSIS — M545 Low back pain, unspecified: Secondary | ICD-10-CM

## 2017-09-02 NOTE — Therapy (Signed)
[facility] [address], [address] [place], [place], [address] Phone: [phone]   Fax:  [phone]  Physical Therapy Treatment  Patient Details  Name: Frank EeBradford T Vint MRN: [id number] Date of Birth: [birth year] Referring Provider: Dr. Faith RogueZachary Swartz  Encounter Date: 09/02/2017      PT End of Session - 09/02/17 1137    Visit Number 5   Date for PT Re-Evaluation 09/28/17   Authorization Type Medicaid   Authorization Time Period 08/30/2017-09/26/2017   Authorization - Visit Number 5   Authorization - Number of Visits 8   PT Start Time 1100   PT Stop Time 1140   PT Time Calculation (min) 40 min   Activity Tolerance Patient tolerated treatment well   Behavior During Therapy [facility] for tasks assessed/performed      Past Medical History:  Diagnosis Date  . 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 FEMUR FRACTURE SURGERY    . NOSE SURGERY    . WISDOM TOOTH EXTRACTION      There were no vitals filed for this visit.      Subjective Assessment - 09/02/17 1108    Subjective My back is feeling better. Sitting at the computer for 45 min. and it is helping my back.    Pertinent History TBI 2009   Limitations Lifting   Patient Stated Goals reduce pain   Currently in Pain? No/denies            [facility] PT Assessment - 09/02/17 0001      AROM   Lumbar Extension decreased by 25%                     [facility] Adult PT Treatment/Exercise - 09/02/17 0001      Lumbar Exercises: Stretches   Single Knee to Chest Stretch 2 reps;20 seconds   Double Knee to Chest Stretch 1 rep;30 seconds   Lower Trunk Rotation 2 reps;30 seconds   Press Ups 5 reps     Lumbar Exercises: Machines for Strengthening   Leg Press bil. 70# 15     Lumbar Exercises: Standing   Row Power tower;Strengthening;Both;10 reps   Row Limitations 25#   Shoulder Extension Power  Tower;Strengthening;Right;Left;10 reps  25#   Other Standing Lumbar Exercises lift 10# 5x, 20# 5x from floor  had some knee pain   Other Standing Lumbar Exercises wall squats 10x; tandem stance and  and pull hand to other hand with red band; diagonals with red band                     PT Long Term Goals - 09/02/17 1144      PT LONG TERM GOAL #1   Title independent with HEP and has handouts to progress himself   Baseline not educated yet   Time 4   Period Weeks   Status On-going     PT LONG TERM GOAL #2   Title ability to sit with pain level </= 1/10 due to improve lumbar mobility   Baseline pain level is 7/10; lumbar extension decreased by 20%   Time 4   Period Weeks   Status On-going     PT LONG TERM GOAL #3   Title lift items < 30# with correct body mechanics due to increase strength of core and pain level </= 1/10   Baseline  lifting 20# wit no pain in therapy  Time 4   Period Weeks   Status On-going               Plan - 09/02/17 1142    Clinical Impression Statement Patient did not have pain in lumbar today.  Patient had pain in his knees with lifting off the floor.  Patient is now sitting at the computer for 45 minutes then takes a break.  Patient is exercising his core in therapy and legs to prepare him for lifting with minimal pain.    Rehab Potential Good   Clinical Impairments Affecting Rehab Potential TBI 2009; polytrauma   PT Frequency 1x / week   PT Duration 4 weeks   PT Treatment/Interventions Cryotherapy;Electrical Stimulation;Moist Heat;Ultrasound;Therapeutic activities;Therapeutic exercise;Neuromuscular re-education;Patient/family education;Manual techniques   PT Next Visit Plan joint mobilization to lumbar; core strengthening;  body mechanics with lifting and daily activities   PT Home Exercise Plan progress as needed   Consulted and Agree with Plan of Care Patient      Patient will benefit from skilled therapeutic intervention in  order to improve the following deficits and impairments:  Pain, Decreased strength, Decreased mobility, Increased fascial restricitons, Decreased activity tolerance, Impaired flexibility, Improper body mechanics  Visit Diagnosis: Muscle weakness (generalized)  Acute left-sided low back pain without sciatica     Problem List Patient Active Problem List   Diagnosis Date Noted  . Spondylosis of lumbar region without myelopathy or radiculopathy 07/26/2017  . Low back pain 11/09/2016  . Postconcussion syndrome 03/03/2013  . Cognitive deficit as late effect of traumatic brain injury (HCC) 02/03/2013  . Insomnia 02/03/2013    Frank Frye, PT 09/02/17 11:46 AM   [facility] [address] [place], [place], [address] Phone: [phone]   Fax:  [phone]  Name: Frank Frye MRN: [id number] Date of Birth: January 08, 1986

## 2017-09-08 ENCOUNTER — Encounter: Payer: Self-pay | Admitting: Physical Therapy

## 2017-09-08 ENCOUNTER — Ambulatory Visit: Payer: Medicaid Other | Admitting: Physical Therapy

## 2017-09-08 DIAGNOSIS — M6281 Muscle weakness (generalized): Secondary | ICD-10-CM | POA: Diagnosis not present

## 2017-09-08 DIAGNOSIS — M545 Low back pain, unspecified: Secondary | ICD-10-CM

## 2017-09-08 NOTE — Patient Instructions (Addendum)
Bracing With March in Bridging (Hook-Lying)    With neutral spine, tighten pelvic floor and abdominals and hold. Lift bottom and hold, then march in place. March _20__ times. Do _1__ times a day.   Copyright  VHI. All rights reserved.   Hip Abduction: Side-Lying (Single Leg)    Lie on side with knees bent, tubing around thighs just above knees. Raise top leg, keeping knee bent. Repeat 15__ times per set. Repeat on other side. Do 1__ sets per session. Do 1__ sessions per week. Blue band http://tub.exer.us/43   Copyright  VHI. All rights reserved.  Side Pull: Double Arm    On back, knees bent, feet flat. Arms perpendicular to body, shoulder level, elbows straight but relaxed. Pull arms out to sides, elbows straight. Resistance band comes across collarbones, hands toward floor. Hold momentarily. Slowly return to starting position. Repeat _10__ times. Band color _green____    Copyright  VHI. All rights reserved.   Over Head Pull: Wide Grip    On back, knees bent, feet flat, band across thighs, elbows straight but relaxed. Pull hands apart (start). Keeping elbows straight, bring arms up and over head, hands toward floor. Keep steady pull on band. Hold momentarily. Return slowly, keeping pull steady, back to start. Repeat _10__ times. Band color _green_____    Copyright  VHI. All rights reserved.  [facility] [address], [address] [place], [place] [address] Phone # [phone] Fax [phone]

## 2017-09-08 NOTE — Therapy (Signed)
[facility] [address], [address] [place], [place], [address] Phone: [phone]   Fax:  [phone]  Physical Therapy Treatment  Patient Details  Name: Frank Frye MRN: [id number] Date of Birth: [birth year] Referring Provider: Dr. Faith RogueZachary Swartz   Encounter Date: 09/08/2017  PT End of Session - 09/08/17 1451    Visit Number  6    Date for PT Re-Evaluation  09/28/17    Authorization Type  Medicaid    Authorization Time Period  08/30/2017-09/26/2017    Authorization - Visit Number  6    Authorization - Number of Visits  8    PT Start Time  1445    PT Stop Time  1525    PT Time Calculation (min)  40 min    Activity Tolerance  Patient tolerated treatment well    Behavior During Therapy  [facility] for tasks assessed/performed       Past Medical History:  Diagnosis Date  . 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 FEMUR FRACTURE SURGERY    . NOSE SURGERY    . WISDOM TOOTH EXTRACTION      There were no vitals filed for this visit.  Subjective Assessment - 09/08/17 1450    Subjective  I have been sitting for 45 min and it has been helping my back.     Pertinent History  TBI 2009    Limitations  Lifting    Patient Stated Goals  reduce pain    Currently in Pain?  No/denies                      [facility] - 09/08/17 0001      Lumbar Exercises: Aerobic   Stationary Bike  L1 x 10 min.  Concurrent review of stauts with pt.    Concurrent review of stauts with pt.      Lumbar Exercises: Standing   Other Standing Lumbar Exercises  lift 20# 5x and walk around the gym 2 times, 30# 5x from floor and walk 3 times around the gym; lift 40# and walk around gym 3 times had some knee pain   had some knee pain            PT Education - 09/08/17 1516    Education provided  Yes    Education Details  back strengthening with theraband     Person(s) Educated  Patient    Methods  Explanation;Demonstration;Verbal cues;Handout    Comprehension  Returned demonstration;Verbalized understanding          PT Long Term Goals - 09/02/17 1144      PT LONG TERM GOAL #1   Title  independent with HEP and has handouts to progress himself    Baseline  not educated yet    Time  4    Period  Weeks    Status  On-going      PT LONG TERM GOAL #2   Title  ability to sit with pain level </= 1/10 due to improve lumbar mobility    Baseline  pain level is 7/10; lumbar extension decreased by 20%    Time  4    Period  Weeks    Status  On-going      PT LONG TERM GOAL #3   Title  lift items < 30# with correct body mechanics due to increase strength of core and pain  level </= 1/10    Baseline   lifting 20# wit no pain in therapy    Time  4    Period  Weeks    Status  On-going            Plan - 09/08/17 1524    Clinical Impression Statement  Patient is not having pain when he is in therapy.  Patient has been working on lifting in therapy and carrying weight.  Pateint has increased in trunk strength by increasing the resistance of the theraband he is using.  Patient will benefit from skilled therapy to increase core strength.     Rehab Potential  Good    Clinical Impairments Affecting Rehab Potential  TBI 2009; polytrauma    PT Frequency  1x / week    PT Duration  4 weeks    PT Treatment/Interventions  Cryotherapy;Electrical Stimulation;Moist Heat;Ultrasound;Therapeutic activities;Therapeutic exercise;Neuromuscular re-education;Patient/family education;Manual techniques    PT Next Visit Plan  core strengthening;  body mechanics with lifting and daily activities; D/C next visit    PT Home Exercise Plan  progress as needed    Consulted and Agree with Plan of Care  Patient       Patient will benefit from skilled therapeutic intervention in order to improve the following deficits and impairments:  Pain, Decreased strength, Decreased  mobility, Increased fascial restricitons, Decreased activity tolerance, Impaired flexibility, Improper body mechanics  Visit Diagnosis: Muscle weakness (generalized)  Acute left-sided low back pain without sciatica     Problem List Patient Active Problem List   Diagnosis Date Noted  . Spondylosis of lumbar region without myelopathy or radiculopathy 07/26/2017  . Low back pain 11/09/2016  . Postconcussion syndrome 03/03/2013  . Cognitive deficit as late effect of traumatic brain injury (HCC) 02/03/2013  . Insomnia 02/03/2013    Eulis Fosterheryl Byrl Latin, PT 09/08/17 3:28 PM   [facility] [address], [address] [place], [place], [address] Phone: [phone]   Fax:  [phone]  Name: Frank Frye MRN: [id number] Date of Birth: Aug 23, 1986

## 2017-09-10 ENCOUNTER — Encounter: Payer: Medicaid Other | Admitting: Physical Therapy

## 2017-09-17 ENCOUNTER — Encounter: Payer: Self-pay | Admitting: Physical Therapy

## 2017-09-17 ENCOUNTER — Ambulatory Visit: Payer: Medicaid Other | Admitting: Physical Therapy

## 2017-09-17 DIAGNOSIS — M6281 Muscle weakness (generalized): Secondary | ICD-10-CM

## 2017-09-17 DIAGNOSIS — M545 Low back pain, unspecified: Secondary | ICD-10-CM

## 2017-09-17 NOTE — Patient Instructions (Signed)
  PNF Strengthening: Resisted   Standing with resistive band around each hand, bring right arm up and away, thumb back. Repeat _10___ times per set. Do _2___ sets per session. Do _1-2___ sessions per day.  Over Head Pull: Wide Grip   Do in siting.  On back, knees bent, feet flat, band across thighs, elbows straight but relaxed. Pull hands apart (start). Keeping elbows straight, bring arms up and over head, hands toward floor. Keep steady pull on band. Hold momentarily. Return slowly, keeping pull steady, back to start. Repeat _10__ times. Band color _green_____     Resisted Horizontal Abduction: Bilateral   Sit or stand, tubing in both hands, arms out in front. Keeping arms straight, pinch shoulder blades together and stretch arms out. Repeat _10___ times per set. Do 2____ sets per session. Do _1-2___ sessions per day.   Bracing With March in Bridging (Hook-Lying)    With neutral spine, tighten pelvic floor and abdominals and hold. Lift bottom and hold, then march in place. March _20__ times. Do _1__ times a day. Hands on chest.   Copyright  VHI. All rights reserved.   Hip Abduction: Side-Lying (Single Leg)    Lie on side with knees bent, tubing around thighs just above knees. Raise top leg, keeping knee bent. Repeat 15__ times per set. Repeat on other side. Do 1__ sets per session. Do 1__ sessions per week. Blue band http://tub.exer.us/43   Ride bike  3 times per week and walk for 45 min 3 times per week  Extension (Prone)    Lift upper body and legs from floor. Do not arch neck. Repeat _10___ times per set. Do __1__ sets per session. Do __1__ sessions per day.  http://orth.exer.us/112   Copyright  VHI. All rights reserved.   Backward Bend (Standing)    Arch backward to make hollow of back deeper. Hold ____ seconds. Repeat _1___ times per set. Do __1__ sets per session. Do __5__ sessions per day.  http://orth.exer.us/178   Copyright  VHI. All  rights reserved.    Arm / Leg Lift: Opposite (Prone)    Lift right leg and opposite arm __2__ inches from floor, keeping knee locked. Hold 2 sec.  Repeat _15___ times per set. Do ___1_ sets per session. Do __1__ sessions per day.  http://orth.exer.us/100   [facility] [address], [address] [place], [place] [address] Phone # [phone] Fax [phone]

## 2017-09-17 NOTE — Therapy (Signed)
[facility] [address], [place], [place], [address] Phone: [phone]   Fax:  [phone]  Physical Therapy Treatment  Patient Details  Name: Frank Frye MRN: [id number] Date of Birth: 1986-04-17 Referring Provider: Dr. Alger Simons   Encounter Date: 09/17/2017  PT End of Session - 09/17/17 1140    Visit Number  7    Date for PT Re-Evaluation  09/28/17    Authorization Type  Medicaid    Authorization Time Period  08/30/2017-09/26/2017    Authorization - Visit Number  7    Authorization - Number of Visits  8    PT Start Time  1100    PT Stop Time  1140    PT Time Calculation (min)  40 min    Activity Tolerance  Patient tolerated treatment well    Behavior During Therapy  [facility] for tasks assessed/performed       Past Medical History:  Diagnosis Date  . Traumatic brain injury [facility])    2009    Past Surgical History:  Procedure Laterality Date  . FEMUR FRACTURE SURGERY    . NOSE SURGERY    . WISDOM TOOTH EXTRACTION      There were no vitals filed for this visit.  Subjective Assessment - 09/17/17 1106    Subjective  I am feeling good.     Patient is accompained by:  Family member mother    Pertinent History  TBI 2009    Limitations  Lifting    Patient Stated Goals  reduce pain    Currently in Pain?  No/denies    Multiple Pain Sites  No         [facility] PT Assessment - 09/17/17 0001      Assessment   Medical Diagnosis  M47.816 of lumbar region withou tmyelopathy or radiculopathy    Onset Date/Surgical Date  06/08/17    Prior Therapy  none      Precautions   Precautions  Back    Precaution Comments  no lifitng over 30#      Restrictions   Weight Bearing Restrictions  No      [place] residence      Prior Function   Level of Independence  Independent    Vocation  Workers comp    Oceanographer and rotate    Leisure  ride bike       Cognition   Overall Cognitive Status  Impaired/Different from baseline TBI      Observation/Other Assessments   Observations  mom helps answer questions    Focus on Therapeutic Outcomes (FOTO)   FOTO score is 41% limitaiton goal is 31% limitation      AROM   Lumbar Extension  full    Lumbar - Right Side Bend  full    Lumbar - Left Side Bend  full      Strength   Overall Strength Comments  bil. hip flexion 5/5; bil. quads /5      Palpation   Spinal mobility  full movement                  [facility] Adult PT Treatment/Exercise - 09/17/17 0001      Lumbar Exercises: Aerobic   Stationary Bike  L1 x 10 min.  Concurrent review of stauts with pt.              PT Education - 09/17/17 1132  Education provided  Yes    Education Details  reviewed HEP and changed supine interscapular to sitting.; core strength    Person(s) Educated  Patient    Methods  Explanation;Demonstration    Comprehension  Verbalized understanding;Returned demonstration          PT Long Term Goals - 09/17/17 1133      PT LONG TERM GOAL #1   Title  independent with HEP and has handouts to progress himself    Time  4    Period  Weeks    Status  Achieved      PT LONG TERM GOAL #2   Title  ability to sit with pain level </= 1/10 due to improve lumbar mobility    Time  4    Period  Weeks    Status  Achieved      PT LONG TERM GOAL #3   Title  lift items < 30# with correct body mechanics due to increase strength of core and pain level </= 1/10    Time  4    Period  Weeks    Status  Achieved            Plan - 09/17/17 1141    Clinical Impression Statement  Patient has full lumbar ROM and strength of lower legs.  Patient is able to sit for 45 minutes without difficulty or pain.  Patient is able to lift 30-40 pounds without pain or difficulty.  Patient is independent with HEP.  Patient is ready for discharge.     Rehab Potential  Good    Clinical Impairments Affecting Rehab  Potential  TBI 2009; polytrauma    PT Treatment/Interventions  Cryotherapy;Electrical Stimulation;Moist Heat;Ultrasound;Therapeutic activities;Therapeutic exercise;Neuromuscular re-education;Patient/family education;Manual techniques    PT Next Visit Plan  Discharge this visit    PT Home Exercise Plan  current HEP    Consulted and Agree with Plan of Care  Patient       Patient will benefit from skilled therapeutic intervention in order to improve the following deficits and impairments:  Pain, Decreased strength, Decreased mobility, Increased fascial restricitons, Decreased activity tolerance, Impaired flexibility, Improper body mechanics  Visit Diagnosis: Muscle weakness (generalized)  Acute left-sided low back pain without sciatica     Problem List Patient Active Problem List   Diagnosis Date Noted  . Spondylosis of lumbar region without myelopathy or radiculopathy 07/26/2017  . Low back pain 11/09/2016  . Postconcussion syndrome 03/03/2013  . Cognitive deficit as late effect of traumatic brain injury ([place]) 02/03/2013  . Insomnia 02/03/2013    Earlie Counts, PT 09/17/17 11:44 AM   [facility] [address], [place], [place], [address] Phone: [phone]   Fax:  [phone]  Name: Frank Frye MRN: [id number] Date of Birth: March 01, 1986 PHYSICAL THERAPY DISCHARGE SUMMARY  Visits from Start of Care: 7  Current functional level related to goals / functional outcomes: See above.    Remaining deficits: See above   Education / Equipment: HEP Plan: Patient agrees to discharge.  Patient goals were met. Patient is being discharged due to meeting the stated rehab goals. Thank you for the referral.Keyleigh Manninen Pearline Cables, PT 09/17/17 11:44 AM   ?????

## 2017-10-06 ENCOUNTER — Ambulatory Visit: Payer: Medicaid Other | Admitting: Physical Medicine & Rehabilitation

## 2017-10-18 ENCOUNTER — Other Ambulatory Visit: Payer: Self-pay | Admitting: Physical Medicine & Rehabilitation

## 2017-10-18 DIAGNOSIS — F5101 Primary insomnia: Secondary | ICD-10-CM

## 2017-10-18 DIAGNOSIS — S069X0S Unspecified intracranial injury without loss of consciousness, sequela: Principal | ICD-10-CM

## 2017-10-18 DIAGNOSIS — F0781 Postconcussional syndrome: Secondary | ICD-10-CM

## 2017-10-18 DIAGNOSIS — R4189 Other symptoms and signs involving cognitive functions and awareness: Secondary | ICD-10-CM

## 2017-10-18 DIAGNOSIS — F068 Other specified mental disorders due to known physiological condition: Secondary | ICD-10-CM

## 2017-10-18 NOTE — Telephone Encounter (Signed)
Prescription accidentally printed, called into pharmacy

## 2017-10-30 ENCOUNTER — Other Ambulatory Visit: Payer: Self-pay | Admitting: Physical Medicine & Rehabilitation

## 2017-11-01 NOTE — Telephone Encounter (Signed)
Called into pharmacy

## 2017-11-19 ENCOUNTER — Other Ambulatory Visit: Payer: Self-pay

## 2017-11-19 ENCOUNTER — Telehealth: Payer: Self-pay

## 2017-11-19 NOTE — Telephone Encounter (Signed)
Prior auth initiated today 11-19-2017

## 2017-11-19 NOTE — Telephone Encounter (Signed)
[facility] has [facility] prior [facility] for belsomra and that he only has a few pills left.

## 2017-11-25 ENCOUNTER — Other Ambulatory Visit: Payer: Self-pay | Admitting: Physical Medicine & Rehabilitation

## 2017-11-25 DIAGNOSIS — R4189 Other symptoms and signs involving cognitive functions and awareness: Secondary | ICD-10-CM

## 2017-11-25 DIAGNOSIS — F0781 Postconcussional syndrome: Secondary | ICD-10-CM

## 2017-11-25 DIAGNOSIS — S069X0S Unspecified intracranial injury without loss of consciousness, sequela: Principal | ICD-10-CM

## 2017-11-25 DIAGNOSIS — F068 Other specified mental disorders due to known physiological condition: Secondary | ICD-10-CM

## 2017-12-27 ENCOUNTER — Ambulatory Visit: Payer: Medicaid Other | Admitting: Physical Medicine & Rehabilitation

## 2018-01-03 ENCOUNTER — Encounter: Payer: Self-pay | Admitting: Physical Medicine & Rehabilitation

## 2018-01-03 ENCOUNTER — Encounter: Payer: Medicaid Other | Attending: Physical Medicine & Rehabilitation | Admitting: Physical Medicine & Rehabilitation

## 2018-01-03 VITALS — BP 116/77 | HR 73

## 2018-01-03 DIAGNOSIS — S069XAS Unspecified intracranial injury with loss of consciousness status unknown, sequela: Secondary | ICD-10-CM

## 2018-01-03 DIAGNOSIS — F0781 Postconcussional syndrome: Secondary | ICD-10-CM | POA: Diagnosis not present

## 2018-01-03 DIAGNOSIS — Z87820 Personal history of traumatic brain injury: Secondary | ICD-10-CM | POA: Insufficient documentation

## 2018-01-03 DIAGNOSIS — M47896 Other spondylosis, lumbar region: Secondary | ICD-10-CM | POA: Insufficient documentation

## 2018-01-03 DIAGNOSIS — G4700 Insomnia, unspecified: Secondary | ICD-10-CM | POA: Diagnosis not present

## 2018-01-03 DIAGNOSIS — S069X0S Unspecified intracranial injury without loss of consciousness, sequela: Secondary | ICD-10-CM

## 2018-01-03 DIAGNOSIS — M545 Low back pain, unspecified: Secondary | ICD-10-CM

## 2018-01-03 DIAGNOSIS — G8929 Other chronic pain: Secondary | ICD-10-CM

## 2018-01-03 DIAGNOSIS — F068 Other specified mental disorders due to known physiological condition: Secondary | ICD-10-CM | POA: Diagnosis not present

## 2018-01-03 DIAGNOSIS — F5101 Primary insomnia: Secondary | ICD-10-CM | POA: Diagnosis not present

## 2018-01-03 DIAGNOSIS — R5383 Other fatigue: Secondary | ICD-10-CM | POA: Diagnosis not present

## 2018-01-03 DIAGNOSIS — Z9119 Patient's noncompliance with other medical treatment and regimen: Secondary | ICD-10-CM | POA: Insufficient documentation

## 2018-01-03 DIAGNOSIS — M5136 Other intervertebral disc degeneration, lumbar region: Secondary | ICD-10-CM | POA: Diagnosis not present

## 2018-01-03 MED ORDER — SUVOREXANT 10 MG PO TABS: 1.0000 | ORAL_TABLET | Freq: Every day | ORAL | 4 refills | Status: DC

## 2018-01-03 MED ORDER — AMPHETAMINE-DEXTROAMPHETAMINE 7.5 MG PO TABS: 7.5000 mg | ORAL_TABLET | Freq: Two times a day (BID) | ORAL | 0 refills | Status: DC

## 2018-01-03 MED ORDER — CLONAZEPAM 1 MG PO TABS: 1.0000 mg | ORAL_TABLET | Freq: Every day | ORAL | 4 refills | Status: DC

## 2018-01-03 NOTE — Patient Instructions (Signed)
PLEASE FEEL FREE TO CALL OUR OFFICE WITH ANY PROBLEMS OR QUESTIONS ([phone])      

## 2018-01-03 NOTE — Progress Notes (Signed)
Subjective:    Patient ID: Frank Frye, male    DOB: [birth year], 32 y.o.   MRN: [id number]  HPI   Frank Frye is here for follow-up of his traumatic brain injury and associated deficits.  He also is here to follow-up his back pain.  He is completed therapy and is back pain has completely resolved.  Asked him if he is doing a home maintenance exercise program he states that he is not regular with his stretching exercises.  He is still working with his Therapist, nutritionalVR coach regarding work.  Nothing new has turned out just yet.  He is sleeping better but still is having some fatigue.   Pain Inventory Average Pain 0 Pain Right Now 0 My pain is na  In the last 24 hours, has pain interfered with the following? General activity 0 Relation with others 0 Enjoyment of life 0 What TIME of day is your pain at its worst? na Sleep (in general) Fair  Pain is worse with: na Pain improves with: na Relief from Meds: na  Mobility walk without assistance ability to climb steps?  no do you drive?  no  Function not employed: date last employed .  Neuro/Psych No problems in this area  Prior Studies Any changes since last visit?  no  Physicians involved in your care Any changes since last visit?  no   No family history on file. Social History   Socioeconomic History  . Marital status: Single    Spouse name: Not on file  . Number of children: Not on file  . Years of education: Not on file  . Highest education level: Not on file  Social Needs  . Financial resource strain: Not on file  . Food insecurity - worry: Not on file  . Food insecurity - inability: Not on file  . Transportation needs - medical: Not on file  . Transportation needs - non-medical: Not on file  Occupational History  . Not on file  Tobacco Use  . Smoking status: Never Smoker  . Smokeless tobacco: Never Used  Substance and Sexual Activity  . Alcohol use: Yes  . Drug use: Not on file  . Sexual activity: Not on file    Other Topics Concern  . Not on file  Social History Narrative  . Not on file   Past Surgical History:  Procedure Laterality Date  . FEMUR FRACTURE SURGERY    . NOSE SURGERY    . WISDOM TOOTH EXTRACTION     Past Medical History:  Diagnosis Date  . Traumatic brain injury (HCC)    2009   There were no vitals taken for this visit.  Opioid Risk Score:   Fall Risk Score:  `1  Depression screen PHQ 2/9  No flowsheet data found.   Review of Systems  Constitutional: Negative.   HENT: Negative.   Eyes: Negative.   Respiratory: Negative.   Cardiovascular: Negative.   Gastrointestinal: Negative.   Endocrine: Negative.   Genitourinary: Negative.   Musculoskeletal: Negative.   Skin: Negative.   Allergic/Immunologic: Negative.   Neurological: Negative.   Hematological: Negative.   Psychiatric/Behavioral: Negative.   All other systems reviewed and are negative.      Objective:   Physical Exam General: Alert and oriented x3 HEENT:PERRL Neck:Supple. No jvd or lymphadenopathy  Heart:Regular rate Chest:  Normal effort Abdomen:Soft, non-tender,BS+.  Extremities:No clubbing, cyanosis, or edema. Pulses are present Skin:Clean and intact without signs of breakdown  Neuro:Ongoing processing delays and issues with attention.  Strength and sensory function are normal.  Musculoskeletal: i minimal back pain with palpation or range of motion today.  Psych:Pt's affect is appropriate. Pt is cooperative and pleasant as always.    Assessment & Plan:  1. 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 Insomnia related to above.  Greatly improved 3. 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  Symptoms have resolved  Plan:  1. Klonopin for now at night. This seems to be working for him 2. Continue trazodone. 3. Belsomra 10mg  qhs has been effective.   5. Continue adderall IR 7.5mg  1-2 tabsbid #75. Refilled today with second  for next month.  6.  Stressed the importance of maintaining core muscle and lower extremity strengthening and range of motion exercises.  This should be part of a regular aerobic exercise program as well 7. Continue to work with VR regarding finding a job which is suitable for him given his cogniitve and physical restrictions.  This may take him some time but he needs to find something that right from a physical and cognitive standpoint  Follow up with me in 4 months.  May come back to pick up 2 more refillls of Adderall in 2 months.

## 2018-05-09 ENCOUNTER — Other Ambulatory Visit: Payer: Self-pay

## 2018-05-09 ENCOUNTER — Encounter: Payer: Medicaid Other | Attending: Physical Medicine & Rehabilitation | Admitting: Physical Medicine & Rehabilitation

## 2018-05-09 ENCOUNTER — Encounter: Payer: Self-pay | Admitting: Physical Medicine & Rehabilitation

## 2018-05-09 VITALS — BP 109/71 | HR 64 | Ht 74.0 in | Wt 163.4 lb

## 2018-05-09 DIAGNOSIS — M545 Low back pain, unspecified: Secondary | ICD-10-CM

## 2018-05-09 DIAGNOSIS — M47816 Spondylosis without myelopathy or radiculopathy, lumbar region: Secondary | ICD-10-CM | POA: Diagnosis not present

## 2018-05-09 DIAGNOSIS — S069X0S Unspecified intracranial injury without loss of consciousness, sequela: Secondary | ICD-10-CM

## 2018-05-09 DIAGNOSIS — G4700 Insomnia, unspecified: Secondary | ICD-10-CM | POA: Diagnosis not present

## 2018-05-09 DIAGNOSIS — G8929 Other chronic pain: Secondary | ICD-10-CM

## 2018-05-09 DIAGNOSIS — M5136 Other intervertebral disc degeneration, lumbar region: Secondary | ICD-10-CM | POA: Insufficient documentation

## 2018-05-09 DIAGNOSIS — F419 Anxiety disorder, unspecified: Secondary | ICD-10-CM | POA: Diagnosis not present

## 2018-05-09 DIAGNOSIS — F0781 Postconcussional syndrome: Secondary | ICD-10-CM | POA: Diagnosis not present

## 2018-05-09 DIAGNOSIS — F5101 Primary insomnia: Secondary | ICD-10-CM

## 2018-05-09 DIAGNOSIS — Z79899 Other long term (current) drug therapy: Secondary | ICD-10-CM | POA: Diagnosis not present

## 2018-05-09 DIAGNOSIS — F068 Other specified mental disorders due to known physiological condition: Secondary | ICD-10-CM

## 2018-05-09 DIAGNOSIS — Z87820 Personal history of traumatic brain injury: Secondary | ICD-10-CM | POA: Diagnosis not present

## 2018-05-09 DIAGNOSIS — S069XAS Unspecified intracranial injury with loss of consciousness status unknown, sequela: Secondary | ICD-10-CM

## 2018-05-09 MED ORDER — TRAZODONE HCL 100 MG PO TABS: ORAL_TABLET | ORAL | 3 refills | Status: DC

## 2018-05-09 MED ORDER — AMPHETAMINE-DEXTROAMPHETAMINE 7.5 MG PO TABS: 7.5000 mg | ORAL_TABLET | Freq: Two times a day (BID) | ORAL | 0 refills | Status: DC

## 2018-05-09 MED ORDER — SUVOREXANT 10 MG PO TABS: 1.0000 | ORAL_TABLET | Freq: Every day | ORAL | 4 refills | Status: DC

## 2018-05-09 MED ORDER — CLONAZEPAM 1 MG PO TABS: 1.0000 mg | ORAL_TABLET | Freq: Every day | ORAL | 4 refills | Status: DC

## 2018-05-09 NOTE — Progress Notes (Signed)
Subjective:    Patient ID: Frank Frye, male    DOB: 25-Apr-1986, 32 y.o.   MRN: [id number]  HPI   Frank Frye is here in follow-up of his traumatic brain injury and associated deficits.  For the most part he has been doing fairly well.  He did unload and spread some mulch the other day which seemed to irritate his back and shoulder areas.  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  Sleep has been improved with the Belsomra and trazodone.  Klonopin helps with anxiety in the evening.   Pain Inventory Average Pain 0 Pain Right Now 0 My pain is no pain  In the last 24 hours, has pain interfered with the following? General activity 0 Relation with others 0 Enjoyment of life 0 What TIME of day is your pain at its worst? no pain Sleep (in general) NA  Pain is worse with: no pain Pain improves with: no pain Relief from Meds: no pain  Mobility walk without assistance  Function not employed: date last employed .  Neuro/Psych No problems in this area  Prior Studies Any changes since last visit?  no  Physicians involved in your care Any changes since last visit?  no   No family history on file. Social History   Socioeconomic History  . Marital status: Single    Spouse name: Not on file  . Number of children: Not on file  . Years of education: Not on file  . Highest education level: Not on file  Occupational History  . Not on file  Social Needs  . Financial resource strain: Not on file  . Food insecurity:    Worry: Not on file    Inability: Not on file  . Transportation needs:    Medical: Not on file    Non-medical: Not on file  Tobacco Use  . Smoking status: Never Smoker  . Smokeless tobacco: Never Used  Substance and Sexual Activity  . Alcohol use: Yes  . Drug use: Not on file  . Sexual activity: Not on file  Lifestyle  . Physical activity:    Days per week: Not on file    Minutes per session: Not on file  .  Stress: Not on file  Relationships  . Social connections:    Talks on phone: Not on file    Gets together: Not on file    Attends religious service: Not on file    Active member of club or organization: Not on file    Attends meetings of clubs or organizations: Not on file    Relationship status: Not on file  Other Topics Concern  . Not on file  Social History Narrative  . Not on file   Past Surgical History:  Procedure Laterality Date  . FEMUR FRACTURE SURGERY    . NOSE SURGERY    . WISDOM TOOTH EXTRACTION     Past Medical History:  Diagnosis Date  . Traumatic brain injury (HCC)    2009   BP 109/71   Pulse 64   Ht 6\' 2"  (1.88 m)   Wt 163 lb 6.4 oz (74.1 kg)   SpO2 93%   BMI 20.98 kg/m   Opioid Risk Score:   Fall Risk Score:  `1  Depression screen PHQ 2/9  Depression screen PHQ 2/9 05/09/2018  Decreased Interest 0  Down, Depressed, Hopeless 0  PHQ - 2 Score 0    Review  of Systems  Constitutional: Negative.   HENT: Negative.   Eyes: Negative.   Respiratory: Negative.   Cardiovascular: Negative.   Gastrointestinal: Negative.   Endocrine: Negative.   Genitourinary: Negative.   Musculoskeletal: Negative.   Skin: Negative.   Allergic/Immunologic: Negative.   Neurological: Negative.   Hematological: Negative.   Psychiatric/Behavioral: Negative.   All other systems reviewed and are negative.      Objective:   Physical Exam  General: No acute distress HEENT: EOMI, oral membranes moist Cards: reg rate  Chest: normal effort Abdomen: Soft, NT, ND Skin: dry, intact Extremities: no edema  Skin:Clean and intact without signs of breakdown  Neuro:Ongoing processing delays and attentional deficits which are baseline. Musculoskeletal: Low back was not examined today.  Psych:Pt's affect is appropriate. Pt is cooperative and pleasant as always.    Assessment & Plan:  1. Late effects of TBI (2009) with attention and processing deficits, ongoing  fatigue 2. Insomnia related to above.  Greatly improved 3. 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  Symptoms have resolved  Plan:  1. Klonopin for now at night. This seems to be working for him 2. Continue trazodone. 3. Belsomra 10mg  qhs has been effective for sleep  5. Continue adderall IR 7.5mg  1-2 tabsbid #75.  Refilled today with second for next month. We will continue the controlled substance monitoring program, this consists of regular clinic visits, examinations, routine drug screening, pill counts as well as use of [place]  Cont[place]. [place].   6. Continue with strength and aerobic training. Needs to be sensible about activities.  7. VR efforts continue.  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  Follow up with me in 6 months. May receive 2 more refillls of Adderall in 2 months. they will call when due.

## 2018-05-09 NOTE — Patient Instructions (Signed)
PLEASE FEEL FREE TO CALL OUR OFFICE WITH ANY PROBLEMS OR QUESTIONS ([phone])      

## 2018-05-26 ENCOUNTER — Telehealth: Payer: Self-pay

## 2018-05-26 NOTE — Telephone Encounter (Signed)
Prior auth fax request from patients pharmacy was recieved on 05-20-18, conducted on same day for Belsomra.  Reviewed [employer] website for approval verification of approval, it was recieved on 05-23-18

## 2018-06-24 ENCOUNTER — Other Ambulatory Visit: Payer: Self-pay | Admitting: Physical Medicine & Rehabilitation

## 2018-06-24 DIAGNOSIS — S069X0S Unspecified intracranial injury without loss of consciousness, sequela: Principal | ICD-10-CM

## 2018-06-24 DIAGNOSIS — F0781 Postconcussional syndrome: Secondary | ICD-10-CM

## 2018-06-24 DIAGNOSIS — R4189 Other symptoms and signs involving cognitive functions and awareness: Secondary | ICD-10-CM

## 2018-06-24 DIAGNOSIS — F5101 Primary insomnia: Secondary | ICD-10-CM

## 2018-06-24 DIAGNOSIS — F068 Other specified mental disorders due to known physiological condition: Secondary | ICD-10-CM

## 2018-11-07 ENCOUNTER — Encounter: Payer: Medicaid Other | Attending: Physical Medicine & Rehabilitation | Admitting: Physical Medicine & Rehabilitation

## 2018-11-07 ENCOUNTER — Encounter: Payer: Self-pay | Admitting: Physical Medicine & Rehabilitation

## 2018-11-07 VITALS — BP 104/69 | HR 66 | Ht 73.0 in | Wt 165.0 lb

## 2018-11-07 DIAGNOSIS — F5101 Primary insomnia: Secondary | ICD-10-CM | POA: Diagnosis present

## 2018-11-07 DIAGNOSIS — Z79899 Other long term (current) drug therapy: Secondary | ICD-10-CM | POA: Diagnosis not present

## 2018-11-07 DIAGNOSIS — R4189 Other symptoms and signs involving cognitive functions and awareness: Secondary | ICD-10-CM

## 2018-11-07 DIAGNOSIS — M545 Low back pain: Secondary | ICD-10-CM | POA: Diagnosis present

## 2018-11-07 DIAGNOSIS — F0781 Postconcussional syndrome: Secondary | ICD-10-CM

## 2018-11-07 DIAGNOSIS — M47816 Spondylosis without myelopathy or radiculopathy, lumbar region: Secondary | ICD-10-CM

## 2018-11-07 DIAGNOSIS — S069X0S Unspecified intracranial injury without loss of consciousness, sequela: Secondary | ICD-10-CM | POA: Diagnosis present

## 2018-11-07 DIAGNOSIS — Z5181 Encounter for therapeutic drug level monitoring: Secondary | ICD-10-CM | POA: Diagnosis not present

## 2018-11-07 DIAGNOSIS — G8929 Other chronic pain: Secondary | ICD-10-CM | POA: Diagnosis present

## 2018-11-07 DIAGNOSIS — F068 Other specified mental disorders due to known physiological condition: Secondary | ICD-10-CM | POA: Insufficient documentation

## 2018-11-07 MED ORDER — CLONAZEPAM 1 MG PO TABS: 1.0000 mg | ORAL_TABLET | Freq: Every day | ORAL | 3 refills | Status: DC

## 2018-11-07 MED ORDER — AMPHETAMINE-DEXTROAMPHETAMINE 7.5 MG PO TABS: 7.5000 mg | ORAL_TABLET | Freq: Two times a day (BID) | ORAL | 0 refills | Status: DC

## 2018-11-07 MED ORDER — TRAZODONE HCL 100 MG PO TABS: ORAL_TABLET | ORAL | 3 refills | Status: DC

## 2018-11-07 MED ORDER — SUVOREXANT 10 MG PO TABS: 1.0000 | ORAL_TABLET | Freq: Every day | ORAL | 4 refills | Status: DC

## 2018-11-07 NOTE — Progress Notes (Signed)
Subjective:    Patient ID: Frank Frye, male    DOB: 08-01-1986, 33 y.o.   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  Last saw him in July.  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  1 of the problems are the physical parameters I set on his work level.  The fact that he is allowed only light duty seems to limit the options for him.  However, he still has low back pain which inhibits him at times even at a household level.  He remains on Belsomra and Klonopin to help with sleep at night in addition to trazodone.  Uses Adderall intermittently during the day when he needs to be at a higher level focus.  Overall mood is reasonable.  Sleep patterns have been generally stable.  He does feel "hung over" sometimes in the morning due to his medications at night.  Pain Inventory Average Pain 2 Pain Right Now 1 My pain is intermittent and dull  In the last 24 hours, has pain interfered with the following? General activity 3 Relation with others 1 Enjoyment of life 3 What TIME of day is your pain at its worst? daytime Sleep (in general) Fair  Pain is worse with: na Pain improves with: na Relief from Meds: na  Mobility walk without assistance  Function not employed: date last employed .  Neuro/Psych No problems in this area  Prior Studies Any changes since last visit?  no  Physicians involved in your care Any changes since last visit?  no   No family history on file. Social History   Socioeconomic History  . Marital status: Single    Spouse name: Not on file  . Number of children: Not on file  . Years of education: Not on file  . Highest education level: Not on file  Occupational History  . Not on file  Social Needs  . Financial resource strain: Not on file  . Food insecurity:    Worry: Not on file    Inability: Not on file  . Transportation needs:   Medical: Not on file    Non-medical: Not on file  Tobacco Use  . Smoking status: Never Smoker  . Smokeless tobacco: Never Used  Substance and Sexual Activity  . Alcohol use: Yes  . Drug use: Not on file  . Sexual activity: Not on file  Lifestyle  . Physical activity:    Days per week: Not on file    Minutes per session: Not on file  . Stress: Not on file  Relationships  . Social connections:    Talks on phone: Not on file    Gets together: Not on file    Attends religious service: Not on file    Active member of club or organization: Not on file    Attends meetings of clubs or organizations: Not on file    Relationship status: Not on file  Other Topics Concern  . Not on file  Social History Narrative  . Not on file   Past Surgical History:  Procedure Laterality Date  . FEMUR FRACTURE SURGERY    . NOSE SURGERY    . WISDOM TOOTH EXTRACTION     Past Medical History:  Diagnosis Date  . Traumatic brain injury (HCC)    2009   BP 104/69   Pulse 66   Ht 6\' 1"  (1.854 m)  Wt 165 lb (74.8 kg)   SpO2 96%   BMI 21.77 kg/m   Opioid Risk Score:   Fall Risk Score:  `1  Depression screen PHQ 2/9  Depression screen PHQ 2/9 05/09/2018  Decreased Interest 0  Down, Depressed, Hopeless 0  PHQ - 2 Score 0     Review of Systems  Constitutional: Negative.   HENT: Negative.   Eyes: Negative.   Respiratory: Negative.   Cardiovascular: Negative.   Gastrointestinal: Negative.   Endocrine: Negative.   Genitourinary: Negative.   Musculoskeletal: Positive for arthralgias, back pain and myalgias.  Skin: Negative.   Allergic/Immunologic: Negative.   Neurological: Negative.   Hematological: Negative.   Psychiatric/Behavioral: Negative.   All other systems reviewed and are negative.      Objective:   Physical Exam  General: No acute distress HEENT: EOMI, oral membranes moist Cards: reg rate  Chest: normal effort Abdomen: Soft, NT, ND Skin: dry, intact Extremities: no  edema Neuro:Ongoing processing delays and attentional deficits which are baseline. Musculoskeletal: Low back was not examined today.  Psych:Pt's affect is appropriate. Pt is cooperative and pleasant as always.    Assessment & Plan:  1. 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 Insomnia related to above.Greatly improved 3. Low back pain, lumbar spondylosis, mild L4-5 DDD.Symptoms have resolved  Plan:  1. Klonopin for now at night. This seems to be working for him, RF today 2. Continue trazodone. RF today 3. Belsomra 10mg  qhs has been effective for sleep. RF today  5. Continue adderall IR 7.5mg  1-2 tabsbid #75.  We will continue the controlled substance monitoring program, this consists of regular clinic visits, examinations, routine drug screening, pill counts as well as use of [place] Controlled Substance Reporting System. [place]CC[place] was reviewed today.   6.Physical HEP.  7.Continue VR efforts.  Will need to keep working to find his niche. Recommended that he try volunteering first  Follow up with me in 6 months.

## 2018-11-07 NOTE — Patient Instructions (Addendum)
PLEASE FEEL FREE TO CALL OUR OFFICE WITH ANY PROBLEMS OR QUESTIONS [phone])   [facility]

## 2018-11-10 LAB — TOXASSURE SELECT,+ANTIDEPR,UR

## 2018-11-14 ENCOUNTER — Telehealth: Payer: Self-pay | Admitting: *Deleted

## 2018-11-14 NOTE — Telephone Encounter (Signed)
Urine drug screen for this encounter is consistent for prescribed controlled medication. Belsomora was reported taken the night before but does not show. It may be below cutoff or not on panel.

## 2018-11-21 ENCOUNTER — Other Ambulatory Visit: Payer: Self-pay | Admitting: Physical Medicine & Rehabilitation

## 2018-11-21 ENCOUNTER — Telehealth: Payer: Self-pay | Admitting: *Deleted

## 2018-11-21 DIAGNOSIS — S069X0S Unspecified intracranial injury without loss of consciousness, sequela: Principal | ICD-10-CM

## 2018-11-21 DIAGNOSIS — F068 Other specified mental disorders due to known physiological condition: Secondary | ICD-10-CM

## 2018-11-21 DIAGNOSIS — F5101 Primary insomnia: Secondary | ICD-10-CM

## 2018-11-21 DIAGNOSIS — F0781 Postconcussional syndrome: Secondary | ICD-10-CM

## 2018-11-21 NOTE — Telephone Encounter (Signed)
Frank Frye called and needs a PA on his belsomora.

## 2018-11-22 NOTE — Telephone Encounter (Signed)
Prior authorization submitted.

## 2018-11-23 NOTE — Telephone Encounter (Signed)
Patient called and asked about PA, PA approved through 05/21/2019. Patient and pharmacy notified.

## 2018-11-29 ENCOUNTER — Telehealth: Payer: Self-pay | Admitting: *Deleted

## 2018-11-29 ENCOUNTER — Other Ambulatory Visit: Payer: Self-pay | Admitting: Physical Medicine & Rehabilitation

## 2018-11-29 DIAGNOSIS — M47816 Spondylosis without myelopathy or radiculopathy, lumbar region: Secondary | ICD-10-CM

## 2018-11-29 DIAGNOSIS — F0781 Postconcussional syndrome: Secondary | ICD-10-CM

## 2018-11-29 DIAGNOSIS — F5101 Primary insomnia: Secondary | ICD-10-CM

## 2018-11-29 DIAGNOSIS — M545 Low back pain, unspecified: Secondary | ICD-10-CM

## 2018-11-29 DIAGNOSIS — S069X0S Unspecified intracranial injury without loss of consciousness, sequela: Secondary | ICD-10-CM

## 2018-11-29 DIAGNOSIS — G8929 Other chronic pain: Secondary | ICD-10-CM

## 2018-11-29 DIAGNOSIS — F068 Other specified mental disorders due to known physiological condition: Secondary | ICD-10-CM

## 2018-11-29 DIAGNOSIS — R4189 Other symptoms and signs involving cognitive functions and awareness: Secondary | ICD-10-CM

## 2018-11-29 DIAGNOSIS — Z79899 Other long term (current) drug therapy: Secondary | ICD-10-CM

## 2018-11-29 DIAGNOSIS — Z5181 Encounter for therapeutic drug level monitoring: Secondary | ICD-10-CM

## 2018-11-29 NOTE — Telephone Encounter (Signed)
Patient left a message stating that [facility] did not receive approval to refill clonazepam (?).  I contacted the patient and left a message asking him to call back to clarify.

## 2018-11-29 NOTE — Telephone Encounter (Signed)
Patient called back and clarified. Called pharmacy to refill

## 2019-03-28 ENCOUNTER — Other Ambulatory Visit: Payer: Self-pay | Admitting: Physical Medicine & Rehabilitation

## 2019-03-28 DIAGNOSIS — Z5181 Encounter for therapeutic drug level monitoring: Secondary | ICD-10-CM

## 2019-03-28 DIAGNOSIS — F068 Other specified mental disorders due to known physiological condition: Secondary | ICD-10-CM

## 2019-03-28 DIAGNOSIS — M545 Low back pain, unspecified: Secondary | ICD-10-CM

## 2019-03-28 DIAGNOSIS — M47816 Spondylosis without myelopathy or radiculopathy, lumbar region: Secondary | ICD-10-CM

## 2019-03-28 DIAGNOSIS — F5101 Primary insomnia: Secondary | ICD-10-CM

## 2019-03-28 DIAGNOSIS — F0781 Postconcussional syndrome: Secondary | ICD-10-CM

## 2019-03-28 DIAGNOSIS — G8929 Other chronic pain: Secondary | ICD-10-CM

## 2019-03-28 DIAGNOSIS — Z79899 Other long term (current) drug therapy: Secondary | ICD-10-CM

## 2019-04-20 ENCOUNTER — Other Ambulatory Visit: Payer: Self-pay | Admitting: Physical Medicine & Rehabilitation

## 2019-04-20 DIAGNOSIS — F0781 Postconcussional syndrome: Secondary | ICD-10-CM

## 2019-04-20 DIAGNOSIS — G8929 Other chronic pain: Secondary | ICD-10-CM

## 2019-04-20 DIAGNOSIS — S069X0S Unspecified intracranial injury without loss of consciousness, sequela: Secondary | ICD-10-CM

## 2019-04-20 DIAGNOSIS — F068 Other specified mental disorders due to known physiological condition: Secondary | ICD-10-CM

## 2019-04-20 DIAGNOSIS — F5101 Primary insomnia: Secondary | ICD-10-CM

## 2019-04-20 NOTE — Telephone Encounter (Signed)
Patients mother called in a refill of adderall, noted that the last refill of this medication was on 11-07-2018.

## 2019-04-21 MED ORDER — AMPHETAMINE-DEXTROAMPHETAMINE 7.5 MG PO TABS: 7.5000 mg | ORAL_TABLET | Freq: Two times a day (BID) | ORAL | 0 refills | Status: DC

## 2019-04-21 NOTE — Telephone Encounter (Signed)
meds refilled. thx

## 2019-05-15 ENCOUNTER — Encounter: Payer: Medicaid Other | Admitting: Physical Medicine & Rehabilitation

## 2019-05-17 ENCOUNTER — Encounter: Payer: Self-pay | Admitting: Physical Medicine & Rehabilitation

## 2019-05-17 ENCOUNTER — Other Ambulatory Visit: Payer: Self-pay

## 2019-05-17 ENCOUNTER — Encounter: Payer: Medicaid Other | Attending: Physical Medicine & Rehabilitation | Admitting: Physical Medicine & Rehabilitation

## 2019-05-17 DIAGNOSIS — Z79899 Other long term (current) drug therapy: Secondary | ICD-10-CM

## 2019-05-17 DIAGNOSIS — F0781 Postconcussional syndrome: Secondary | ICD-10-CM | POA: Diagnosis present

## 2019-05-17 DIAGNOSIS — M545 Low back pain, unspecified: Secondary | ICD-10-CM

## 2019-05-17 DIAGNOSIS — M47816 Spondylosis without myelopathy or radiculopathy, lumbar region: Secondary | ICD-10-CM | POA: Diagnosis present

## 2019-05-17 DIAGNOSIS — S069X0S Unspecified intracranial injury without loss of consciousness, sequela: Secondary | ICD-10-CM | POA: Diagnosis present

## 2019-05-17 DIAGNOSIS — F068 Other specified mental disorders due to known physiological condition: Secondary | ICD-10-CM | POA: Diagnosis not present

## 2019-05-17 DIAGNOSIS — F5101 Primary insomnia: Secondary | ICD-10-CM | POA: Diagnosis present

## 2019-05-17 DIAGNOSIS — Z5181 Encounter for therapeutic drug level monitoring: Secondary | ICD-10-CM | POA: Diagnosis present

## 2019-05-17 DIAGNOSIS — G8929 Other chronic pain: Secondary | ICD-10-CM

## 2019-05-17 MED ORDER — CLONAZEPAM 1 MG PO TABS: ORAL_TABLET | ORAL | 2 refills | Status: DC

## 2019-05-17 MED ORDER — TRAZODONE HCL 100 MG PO TABS: ORAL_TABLET | ORAL | 3 refills | Status: DC

## 2019-05-17 NOTE — Patient Instructions (Signed)
PLEASE FEEL FREE TO CALL OUR OFFICE WITH ANY PROBLEMS OR QUESTIONS ([phone])     CONTINUE WORKING ON ORGANIZATION AND A SCHEDULE AT HOME. BUILD EXTERNAL MEMORY AIDS WHICH HELP YOU EACH DAY!

## 2019-05-17 NOTE — Progress Notes (Signed)
Subjective:    Patient ID: Frank Frye, male    DOB: 01-Nov-1986, 33 y.o.   MRN: [id number]  HPI   Frank Frye is here in follow up of his TBI. He has been exercising at home on a regular basis. He likes to do some weight lifting as well as aerobic activity when he can. He is taking part in a virtual job club on line. His job search was halted by COVID 19.   He is using adderall only when needed. He remains on belsomra and klonopin at night. Sleep has been solid. Mood is up beat    Pain Inventory Average Pain 0 Pain Right Now 0 My pain is no pain  In the last 24 hours, has pain interfered with the following? General activity 0 Relation with others 0 Enjoyment of life 0 What TIME of day is your pain at its worst? no pain Sleep (in general) Poor  Pain is worse with: no pain Pain improves with: no pain Relief from Meds: no pain  Mobility walk without assistance  Function not employed: date last employed na  Neuro/Psych No problems in this area  Prior Studies Any changes since last visit?  no  Physicians involved in your care Any changes since last visit?  no   No family history on file. Social History   Socioeconomic History  . Marital status: Single    Spouse name: Not on file  . Number of children: Not on file  . Years of education: Not on file  . Highest education level: Not on file  Occupational History  . Not on file  Social Needs  . Financial resource strain: Not on file  . Food insecurity    Worry: Not on file    Inability: Not on file  . Transportation needs    Medical: Not on file    Non-medical: Not on file  Tobacco Use  . Smoking status: Never Smoker  . Smokeless tobacco: Never Used  Substance and Sexual Activity  . Alcohol use: Yes  . Drug use: Not on file  . Sexual activity: Not on file  Lifestyle  . Physical activity    Days per week: Not on file    Minutes per session: Not on file  . Stress: Not on file  Relationships  . Social  Herbalist on phone: Not on file    Gets together: Not on file    Attends religious service: Not on file    Active member of club or organization: Not on file    Attends meetings of clubs or organizations: Not on file    Relationship status: Not on file  Other Topics Concern  . Not on file  Social History Narrative  . Not on file   Past Surgical History:  Procedure Laterality Date  . FEMUR FRACTURE SURGERY    . NOSE SURGERY    . WISDOM TOOTH EXTRACTION     Past Medical History:  Diagnosis Date  . Traumatic brain injury ([place])    2009   BP 116/78   Pulse 74   Temp (!) 97 F (36.1 C)   Ht 6\' 2"  (1.88 m)   Wt 162 lb (73.5 kg)   SpO2 96%   BMI 20.80 kg/m   Opioid Risk Score:   Fall Risk Score:  `1  Depression screen PHQ 2/9  Depression screen [facility] 2/9 05/17/2019 05/09/2018  Decreased Interest 0 0  Down, Depressed, Hopeless 0 0  PHQ -  2 Score 0 0    Review of Systems  Constitutional: Negative.   HENT: Negative.   Eyes: Negative.   Respiratory: Negative.   Cardiovascular: Negative.   Gastrointestinal: Negative.   Endocrine: Negative.   Genitourinary: Negative.   Musculoskeletal: Negative.   Skin: Negative.   Allergic/Immunologic: Negative.   Neurological: Negative.   Hematological: Negative.   Psychiatric/Behavioral: Negative.   All other systems reviewed and are negative.      Objective:   Physical Exam General: No acute distress HEENT: EOMI, oral membranes moist Cards: reg rate  Chest: normal effort Abdomen: Soft, NT, ND Skin: dry, intact Extremities: no edema Neuro:Ongoing processing delays and attentional deficits which are baseline. Musculoskeletal:Low back was not examined today.  Psych:Pt's affect is appropriate. Pt is cooperative and pleasant as always.    Assessment & Plan:  1. 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 Insomnia related to above.Greatly improved 3. Low back pain,  lumbar spondylosis, mild L4-5 DDD.Symptoms have resolved  Plan:  1. Klonopin for now at night.  RF todayd 2. Continue trazodone. RF today 3. Belsomra 10mg  qhs has been effectivefor sleep. No RF today 5. Continue adderall IR 7.5mg  1-2 tabsbid #75. We will continue the controlled substance monitoring program, this consists of regular clinic visits, examinations, routine drug screening, pill counts as well as use of [place] Controlled Substance Reporting System. [place]CC[place] was reviewed today.   6.Physical HEP. 7.Continue VR efforts as able. He is taking part in a virtual VR group on line at present during COVID 19.  Still think volunteering would be a good at some point   Follow up with me in796months.

## 2019-05-26 ENCOUNTER — Telehealth: Payer: Self-pay | Admitting: Physical Medicine & Rehabilitation

## 2019-05-26 NOTE — Telephone Encounter (Signed)
Prior auth submitted to [employer] today 05-26-2019

## 2019-05-26 NOTE — Telephone Encounter (Signed)
[facility], patients mother is calling about prior [place] for [employer].  Patient will be out of this medication as of today.  Please call her at [phone].

## 2019-05-29 NOTE — Telephone Encounter (Signed)
Recieved response for the prior auth, it has returned as approved on 05-29-2019

## 2019-06-11 ENCOUNTER — Ambulatory Visit (HOSPITAL_COMMUNITY)
Admission: EM | Admit: 2019-06-11 | Discharge: 2019-06-11 | Disposition: A | Discharge status: Home / Self Care | Payer: Medicaid Other | Attending: Family Medicine | Admitting: Family Medicine

## 2019-06-11 ENCOUNTER — Encounter (HOSPITAL_COMMUNITY): Payer: Self-pay

## 2019-06-11 ENCOUNTER — Other Ambulatory Visit: Payer: Self-pay

## 2019-06-11 DIAGNOSIS — T63481A Toxic effect of venom of other arthropod, accidental (unintentional), initial encounter: Secondary | ICD-10-CM

## 2019-06-11 MED ORDER — FAMOTIDINE 20 MG PO TABS: 20.0000 mg | ORAL_TABLET | Freq: Two times a day (BID) | ORAL | 0 refills | Status: DC

## 2019-06-11 MED ORDER — FAMOTIDINE 20 MG PO TABS
20.0000 mg | ORAL_TABLET | Freq: Once | ORAL | Status: AC
  Administered 2019-06-11: 20 mg via ORAL

## 2019-06-11 MED ORDER — FAMOTIDINE 20 MG PO TABS
ORAL_TABLET | ORAL | Status: AC
  Filled 2019-06-11: qty 2

## 2019-06-11 MED ORDER — METHYLPREDNISOLONE ACETATE 80 MG/ML IJ SUSP
INTRAMUSCULAR | Status: AC
  Filled 2019-06-11: qty 1

## 2019-06-11 MED ORDER — METHYLPREDNISOLONE ACETATE 80 MG/ML IJ SUSP
80.0000 mg | Freq: Once | INTRAMUSCULAR | Status: AC
  Administered 2019-06-11: 80 mg via INTRAMUSCULAR

## 2019-06-11 NOTE — Discharge Instructions (Signed)
Ice application. Regular benadryl Pepcid twice a day. I do expect that this will improve in the next week.

## 2019-06-11 NOTE — ED Triage Notes (Signed)
Pt states he was stung by a yellow jackets bee yesterday. Pt cc both hands

## 2019-06-11 NOTE — ED Provider Notes (Signed)
[place]    CSN: [id number] Arrival date & time: 06/11/19  1719      History   Chief Complaint Chief Complaint  Patient presents with  . bee stings    HPI Frank Frye is a 33 y.o. male.   [facility] presents with complaints of yellow jacket stings to bilateral hands, which occurred yesterday. Has been taking benadryl since. Today with increased redness and swelling. Took 20mg  of benadryl his mother gave to him this morning as well. No necessarily painful or itching, just swollen. No difficulty breathing, no shortness of breath , no facial swelling, throat pain or swelling, no difficulty swallowing. History  Of TBI, insomnia. No previous allergic response to stings.     ROS per HPI, negative if not otherwise mentioned.      Past Medical History:  Diagnosis Date  . Traumatic brain injury [facility])    2009    Patient Active Problem List   Diagnosis Date Noted  . Spondylosis of lumbar region without myelopathy or radiculopathy 07/26/2017  . Low back pain 11/09/2016  . Postconcussion syndrome 03/03/2013  . Cognitive deficit as late effect of traumatic brain injury ([place]) 02/03/2013  . Insomnia 02/03/2013    Past Surgical History:  Procedure Laterality Date  . FEMUR FRACTURE SURGERY    . NOSE SURGERY    . WISDOM TOOTH EXTRACTION         Home Medications    Prior to Admission medications   Medication Sig Start Date End Date Taking? Authorizing Provider  amphetamine-dextroamphetamine (ADDERALL) 7.5 MG tablet Take 1-2 tablets by mouth 2 (two) times daily. 04/21/19   Meredith Staggers, MD  BELSOMRA 10 MG TABS TAKE 1 TABLET BY MOUTH AT BEDTIME 04/21/19   Meredith Staggers, MD  cholecalciferol (VITAMIN D) 1000 units tablet Take 1,000 Units by mouth daily.    [provider]  clonazePAM (KLONOPIN) 1 MG tablet TAKE 1 TABLET(1 MG) BY MOUTH AT BEDTIME 05/17/19   Meredith Staggers, MD  famotidine (PEPCID) 20 MG tablet Take 1 tablet (20 mg  total) by mouth 2 (two) times daily. 06/11/19   Zigmund Gottron, NP  fish oil-omega-3 fatty acids 1000 MG capsule Take 2 g by mouth daily.    [provider]  Multiple Vitamin (MULTIVITAMIN) capsule Take 1 capsule by mouth daily.    [provider]  traZODone (DESYREL) 100 MG tablet TAKE 3 TABLETS(300 MG) BY MOUTH AT BEDTIME 05/17/19   Meredith Staggers, MD    Family History History reviewed. No pertinent family history.  Social History Social History   Tobacco Use  . Smoking status: Never Smoker  . Smokeless tobacco: Never Used  Substance Use Topics  . Alcohol use: Yes  . Drug use: Not on file     Allergies   Morphine and related and Vicodin [hydrocodone-acetaminophen]   Review of Systems Review of Systems   Physical Exam Triage Vital Signs ED Triage Vitals  Enc Vitals Group     BP 06/11/19 1732 107/70     Pulse Rate 06/11/19 1732 81     Resp 06/11/19 1732 18     Temp 06/11/19 1732 98.9 F (37.2 C)     Temp Source 06/11/19 1732 Oral     SpO2 06/11/19 1732 98 %     Weight 06/11/19 1730 164 lb (74.4 kg)     Height --      Head Circumference --      Peak Flow --  Pain Score 06/11/19 1730 5     Pain Loc --      Pain Edu? --      Excl. in GC? --    No data found.  Updated Vital Signs BP 107/70 (BP Location: Right Arm)   Pulse 81   Temp 98.9 F (37.2 C) (Oral)   Resp 18   Wt 164 lb (74.4 kg)   SpO2 98%   BMI 21.06 kg/m   Visual Acuity Right Eye Distance:   Left Eye Distance:   Bilateral Distance:    Right Eye Near:   Left Eye Near:    Bilateral Near:     Physical Exam Constitutional:      Appearance: He is well-developed.  HENT:     Head: Normocephalic and atraumatic.     Mouth/Throat:     Mouth: No angioedema.     Comments: Speaking clearly, no work of breathing no facial swelling  Cardiovascular:     Rate and Rhythm: Normal rate.  Pulmonary:     Effort: Pulmonary effort is normal.  Musculoskeletal:     Comments:  Right hand with swelling which originates at thumb and swells to wrist, hand and thumb; left hand with swelling to fingers  Skin:    General: Skin is warm and dry.  Neurological:     Mental Status: He is alert and oriented to person, place, and time.      UC Treatments / Results  Labs (all labs ordered are listed, but only abnormal results are displayed) Labs Reviewed - No data to display  EKG   Radiology No results found.  Procedures Procedures (including critical care time)  Medications Ordered in UC Medications  methylPREDNISolone acetate (DEPO-MEDROL) injection 80 mg (80 mg Intramuscular Given 06/11/19 1810)  famotidine (PEPCID) tablet 20 mg (20 mg Oral Given 06/11/19 1811)  famotidine (PEPCID) 20 MG tablet (has no administration in time range)  methylPREDNISolone acetate (DEPO-MEDROL) 80 MG/ML injection (has no administration in time range)    Initial Impression / Assessment and Plan / UC Course  I have reviewed the triage vital signs and the nursing notes.  Pertinent labs & imaging results that were available during my care of the patient were reviewed by me and considered in my medical decision making (see chart for details).     depomedrol provided for significant swelling to bilateral hands s/p yellow jacket stings. Continue with benadryl. No oral or facial involvement. Return precautions provided. Patient and mother verbalized understanding and agreeable to plan.   Final Clinical Impressions(s) / UC Diagnoses   Final diagnoses:  Insect stings, accidental or unintentional, initial encounter     Discharge Instructions     Ice application. Regular benadryl Pepcid twice a day. I do expect that this will improve in the next week.    ED Prescriptions    Medication Sig Dispense Auth. Provider   famotidine (PEPCID) 20 MG tablet Take 1 tablet (20 mg total) by mouth 2 (two) times daily. 30 tablet Georgetta HaberBurky, Natalie B, NP     Controlled Substance Prescriptions [place]  Controlled Substance Registry consulted? Not Applicable   Georgetta HaberBurky, Natalie B, NP 06/11/19 [phone]

## 2019-08-23 ENCOUNTER — Other Ambulatory Visit: Payer: Self-pay | Admitting: Physical Medicine & Rehabilitation

## 2019-08-23 DIAGNOSIS — M47816 Spondylosis without myelopathy or radiculopathy, lumbar region: Secondary | ICD-10-CM

## 2019-08-23 DIAGNOSIS — G8929 Other chronic pain: Secondary | ICD-10-CM

## 2019-08-23 DIAGNOSIS — F5101 Primary insomnia: Secondary | ICD-10-CM

## 2019-08-23 DIAGNOSIS — S069X0S Unspecified intracranial injury without loss of consciousness, sequela: Secondary | ICD-10-CM

## 2019-08-23 DIAGNOSIS — F068 Other specified mental disorders due to known physiological condition: Secondary | ICD-10-CM

## 2019-08-23 DIAGNOSIS — F0781 Postconcussional syndrome: Secondary | ICD-10-CM

## 2019-08-23 DIAGNOSIS — M545 Low back pain, unspecified: Secondary | ICD-10-CM

## 2019-08-23 DIAGNOSIS — Z79899 Other long term (current) drug therapy: Secondary | ICD-10-CM

## 2019-08-23 DIAGNOSIS — Z5181 Encounter for therapeutic drug level monitoring: Secondary | ICD-10-CM

## 2019-09-14 ENCOUNTER — Telehealth: Payer: Self-pay

## 2019-09-14 DIAGNOSIS — S069X0S Unspecified intracranial injury without loss of consciousness, sequela: Secondary | ICD-10-CM

## 2019-09-14 DIAGNOSIS — R4189 Other symptoms and signs involving cognitive functions and awareness: Secondary | ICD-10-CM

## 2019-09-14 DIAGNOSIS — M545 Low back pain, unspecified: Secondary | ICD-10-CM

## 2019-09-14 DIAGNOSIS — F068 Other specified mental disorders due to known physiological condition: Secondary | ICD-10-CM

## 2019-09-14 DIAGNOSIS — F0781 Postconcussional syndrome: Secondary | ICD-10-CM

## 2019-09-14 DIAGNOSIS — G8929 Other chronic pain: Secondary | ICD-10-CM

## 2019-09-14 MED ORDER — AMPHETAMINE-DEXTROAMPHETAMINE 7.5 MG PO TABS: 7.5000 mg | ORAL_TABLET | Freq: Two times a day (BID) | ORAL | 0 refills | Status: DC

## 2019-09-14 NOTE — Telephone Encounter (Signed)
Signed and delivered. thx

## 2019-09-14 NOTE — Telephone Encounter (Signed)
Patient called requesting a refill of adderall medication.  According to pmp, last prescription was recieved on 04-21-2019

## 2019-11-22 ENCOUNTER — Encounter: Payer: Medicaid Other | Attending: Physical Medicine & Rehabilitation | Admitting: Physical Medicine & Rehabilitation

## 2019-11-22 ENCOUNTER — Encounter: Payer: Self-pay | Admitting: Physical Medicine & Rehabilitation

## 2019-11-22 ENCOUNTER — Other Ambulatory Visit: Payer: Self-pay

## 2019-11-22 VITALS — BP 116/73 | HR 80 | Temp 98.2°F | Ht 74.0 in | Wt 160.0 lb

## 2019-11-22 DIAGNOSIS — M47816 Spondylosis without myelopathy or radiculopathy, lumbar region: Secondary | ICD-10-CM

## 2019-11-22 DIAGNOSIS — F068 Other specified mental disorders due to known physiological condition: Secondary | ICD-10-CM | POA: Diagnosis present

## 2019-11-22 DIAGNOSIS — Z79899 Other long term (current) drug therapy: Secondary | ICD-10-CM

## 2019-11-22 DIAGNOSIS — G8929 Other chronic pain: Secondary | ICD-10-CM | POA: Insufficient documentation

## 2019-11-22 DIAGNOSIS — S069X0S Unspecified intracranial injury without loss of consciousness, sequela: Secondary | ICD-10-CM | POA: Diagnosis present

## 2019-11-22 DIAGNOSIS — Z5181 Encounter for therapeutic drug level monitoring: Secondary | ICD-10-CM

## 2019-11-22 DIAGNOSIS — S069XAS Unspecified intracranial injury with loss of consciousness status unknown, sequela: Secondary | ICD-10-CM

## 2019-11-22 DIAGNOSIS — M545 Low back pain, unspecified: Secondary | ICD-10-CM

## 2019-11-22 DIAGNOSIS — Z79891 Long term (current) use of opiate analgesic: Secondary | ICD-10-CM

## 2019-11-22 DIAGNOSIS — F0781 Postconcussional syndrome: Secondary | ICD-10-CM | POA: Diagnosis not present

## 2019-11-22 DIAGNOSIS — G894 Chronic pain syndrome: Secondary | ICD-10-CM

## 2019-11-22 DIAGNOSIS — F5101 Primary insomnia: Secondary | ICD-10-CM

## 2019-11-22 MED ORDER — AMPHETAMINE-DEXTROAMPHETAMINE 7.5 MG PO TABS: 7.5000 mg | ORAL_TABLET | Freq: Two times a day (BID) | ORAL | 0 refills | Status: DC

## 2019-11-22 MED ORDER — CLONAZEPAM 1 MG PO TABS: ORAL_TABLET | ORAL | 2 refills | Status: DC

## 2019-11-22 MED ORDER — BELSOMRA 10 MG PO TABS: 1.0000 | ORAL_TABLET | Freq: Every day | ORAL | 4 refills | Status: DC

## 2019-11-22 NOTE — Progress Notes (Signed)
Subjective:    Patient ID: Frank Frye, male    DOB: 23-Dec-1985, 34 y.o.   MRN: [id number]  HPI   Frank Frye is here in follow up of his TBI and associated deficits. 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 He meets with them every so often to discuss his resume.     Pain Inventory Average Pain 0 Pain Right Now 0 My pain is na  In the last 24 hours, has pain interfered with the following? General activity 0 Relation with others 0 Enjoyment of life 0 What TIME of day is your pain at its worst? na Sleep (in general) Fair  Pain is worse with: na Pain improves with: na Relief from Meds: na  Mobility walk without assistance  Function Do you have any goals in this area?  no  Neuro/Psych No problems in this area  Prior Studies Any changes since last visit?  no  Physicians involved in your care Any changes since last visit?  no   No family history on file. Social History   Socioeconomic History  . Marital status: Single    Spouse name: Not on file  . Number of children: Not on file  . Years of education: Not on file  . Highest education level: Not on file  Occupational History  . Not on file  Tobacco Use  . Smoking status: Never Smoker  . Smokeless tobacco: Never Used  Substance and Sexual Activity  . Alcohol use: Yes  . Drug use: Not on file  . Sexual activity: Not on file  Other Topics Concern  . Not on file  Social History Narrative  . Not on file   Social Determinants of Health   Financial Resource Strain:   . Difficulty of Paying Living Expenses: Not on file  Food Insecurity:   . Worried About Programme researcher, broadcasting/film/video in the Last Year: Not on file  . Ran Out of Food in the Last Year: Not on file  Transportation Needs:   . Lack of Transportation (Medical): Not on file  . Lack of Transportation (Non-Medical): Not on file  Physical Activity:   . Days of Exercise per Week: Not on file  . Minutes of Exercise per Session: Not  on file  Stress:   . Feeling of Stress : Not on file  Social Connections:   . Frequency of Communication with Friends and Family: Not on file  . Frequency of Social Gatherings with Friends and Family: Not on file  . Attends Religious Services: Not on file  . Active Member of Clubs or Organizations: Not on file  . Attends Banker Meetings: Not on file  . Marital Status: Not on file   Past Surgical History:  Procedure Laterality Date  . FEMUR FRACTURE SURGERY    . NOSE SURGERY    . WISDOM TOOTH EXTRACTION     Past Medical History:  Diagnosis Date  . Traumatic brain injury (HCC)    2009   BP 116/73   Pulse 80   Temp 98.2 F (36.8 C)   Ht 6\' 2"  (1.88 m)   Wt 160 lb (72.6 kg)   SpO2 98%   BMI 20.54 kg/m   Opioid Risk Score:   Fall Risk Score:  `1  Depression screen PHQ 2/9  Depression screen [facility] 2/9 05/17/2019 05/09/2018  Decreased Interest 0 0  Down, Depressed, Hopeless 0 0  PHQ - 2 Score 0 0    Review of Systems  All other systems reviewed and are negative.      Objective:   Physical Exam General: No acute distress HEENT: EOMI, oral membranes moist Cards: reg rate  Chest: normal effort Abdomen: Soft, NT, ND Skin: dry, intact, nodules in right parietal scalp.  Extremities: no edema Neuro:delayed processing and STM deficits.  Musculoskeletal:Low back was not examined today.  Psych:flat but appropriate.    Assessment & Plan:  1. 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 Insomnia related to above.Greatly improved 3. Low back pain, lumbar spondylosis, mild L4-5 DDD.Symptoms have resolved  Plan:   1. Klonopin for now at night.  RF today 2. Continue trazodone.RF today 3. Belsomra 10mg  qhs has been effectivefor sleep. RF today 5. Continue adderall IR 7.5mg  1-2 tabsbid #75. We will continue the controlled substance monitoring program, this consists of regular clinic visits, examinations,  routine drug screening, pill counts as well as use of [place] Controlled Substance Reporting System. [place] was [place] today.   6.Continue VR efforts as able. He is taking part in a virtual VR group on line at present during [place] 19.Still think volunteering is an option. Needs to go out on his own and call about some basic work or volunteering   Fifteen minutes of face to face patient care time were spent during this visit. All questions were encouraged and answered.  Follow up with me in 6 mos .

## 2019-11-22 NOTE — Patient Instructions (Signed)
COVID-19 Vaccine Information can be found at: [email or web address] For questions related to vaccine distribution or appointments, please email [email or web address] or call [phone].     TRY HYDROCORTISONE CREAM OR BENADRYL CREAM TO AREAS ON SCALP  YOU COULD SEE A PLASTIC SURGEON TO REMOVE THE NODULES ON YOUR SCALP. ?[facility].    LOOK INTO SOME SHORT TERM WORK FOR A FEW HOURS A WEEK.

## 2019-11-24 ENCOUNTER — Other Ambulatory Visit: Payer: Self-pay | Admitting: Physical Medicine & Rehabilitation

## 2019-11-24 DIAGNOSIS — F068 Other specified mental disorders due to known physiological condition: Secondary | ICD-10-CM

## 2019-11-24 DIAGNOSIS — S069X0S Unspecified intracranial injury without loss of consciousness, sequela: Secondary | ICD-10-CM

## 2019-11-24 DIAGNOSIS — F5101 Primary insomnia: Secondary | ICD-10-CM

## 2019-11-24 DIAGNOSIS — F0781 Postconcussional syndrome: Secondary | ICD-10-CM

## 2019-11-27 ENCOUNTER — Telehealth: Payer: Self-pay | Admitting: *Deleted

## 2019-11-27 NOTE — Telephone Encounter (Signed)
Prior authorization submitted via [place] [employer] for [employer].  Approved  Effective Begin Date: 11/27/2019 Effective End Date: 05/25/2020  Pharmacy notified, patient notified

## 2020-01-21 ENCOUNTER — Other Ambulatory Visit: Payer: Self-pay | Admitting: Physical Medicine & Rehabilitation

## 2020-01-21 DIAGNOSIS — F0781 Postconcussional syndrome: Secondary | ICD-10-CM

## 2020-01-21 DIAGNOSIS — F068 Other specified mental disorders due to known physiological condition: Secondary | ICD-10-CM

## 2020-01-21 DIAGNOSIS — F5101 Primary insomnia: Secondary | ICD-10-CM

## 2020-01-22 NOTE — Telephone Encounter (Signed)
Belsomra called in to pharmacy 

## 2020-02-09 ENCOUNTER — Ambulatory Visit: Payer: Medicaid Other | Attending: Internal Medicine

## 2020-02-09 DIAGNOSIS — Z23 Encounter for immunization: Secondary | ICD-10-CM

## 2020-02-09 NOTE — Progress Notes (Signed)
   Covid-19 Vaccination Clinic  Name:  Frank Frye    MRN: [id number] DOB: 1986-05-24  02/09/2020  Mr. [name] was observed post Covid-19 immunization for 15 minutes without incident. He was provided with Vaccine Information Sheet and instruction to access the V-Safe system.   Mr. [name] was instructed to call 911 with any severe reactions post vaccine: Marland Kitchen Difficulty breathing  . Swelling of face and throat  . A fast heartbeat  . A bad rash all over body  . Dizziness and weakness   Immunizations Administered    Name Date Dose VIS Date Route   Moderna COVID-19 Vaccine 02/09/2020 12:07 PM 0.5 mL 10/03/2019 Intramuscular   Manufacturer: Moderna   Lot: [id number]   NDC: 80998-338-25

## 2020-02-21 ENCOUNTER — Other Ambulatory Visit: Payer: Self-pay | Admitting: Physical Medicine & Rehabilitation

## 2020-02-21 DIAGNOSIS — Z79899 Other long term (current) drug therapy: Secondary | ICD-10-CM

## 2020-02-21 DIAGNOSIS — F068 Other specified mental disorders due to known physiological condition: Secondary | ICD-10-CM

## 2020-02-21 DIAGNOSIS — F0781 Postconcussional syndrome: Secondary | ICD-10-CM

## 2020-02-21 DIAGNOSIS — S069X0S Unspecified intracranial injury without loss of consciousness, sequela: Secondary | ICD-10-CM

## 2020-02-21 DIAGNOSIS — F5101 Primary insomnia: Secondary | ICD-10-CM

## 2020-02-21 DIAGNOSIS — G8929 Other chronic pain: Secondary | ICD-10-CM

## 2020-02-21 DIAGNOSIS — R4189 Other symptoms and signs involving cognitive functions and awareness: Secondary | ICD-10-CM

## 2020-02-21 DIAGNOSIS — M47816 Spondylosis without myelopathy or radiculopathy, lumbar region: Secondary | ICD-10-CM

## 2020-02-21 DIAGNOSIS — M545 Low back pain, unspecified: Secondary | ICD-10-CM

## 2020-02-21 DIAGNOSIS — Z5181 Encounter for therapeutic drug level monitoring: Secondary | ICD-10-CM

## 2020-03-12 ENCOUNTER — Ambulatory Visit: Payer: Medicaid Other | Attending: Internal Medicine

## 2020-03-12 DIAGNOSIS — Z23 Encounter for immunization: Secondary | ICD-10-CM

## 2020-03-12 NOTE — Progress Notes (Signed)
   Covid-19 Vaccination Clinic  Name:  Frank Frye    MRN: [id number] DOB: Jul 06, 1986  03/12/2020  Mr. [name] was observed post Covid-19 immunization for 15 minutes without incident. He was provided with Vaccine Information Sheet and instruction to access the V-Safe system.   Mr. [name] was instructed to call 911 with any severe reactions post vaccine: Marland Kitchen Difficulty breathing  . Swelling of face and throat  . A fast heartbeat  . A bad rash all over body  . Dizziness and weakness   Immunizations Administered    Name Date Dose VIS Date Route   Moderna COVID-19 Vaccine 03/12/2020 12:12 PM 0.5 mL 10/2019 Intramuscular   Manufacturer: Gala Murdoch   Lot: [id number]   NDC: 79892-119-41      Covid-19 Vaccination Clinic  Name:  Frank Frye    MRN: [id number] DOB: September 30, 1986  03/12/2020  Mr. [name] was observed post Covid-19 immunization for 15 minutes without incident. He was provided with Vaccine Information Sheet and instruction to access the V-Safe system.   Mr. [name] was instructed to call 911 with any severe reactions post vaccine: Marland Kitchen Difficulty breathing  . Swelling of face and throat  . A fast heartbeat  . A bad rash all over body  . Dizziness and weakness   Immunizations Administered    Name Date Dose VIS Date Route   Moderna COVID-19 Vaccine 03/12/2020 12:12 PM 0.5 mL 10/2019 Intramuscular   Manufacturer: Moderna   Lot: [id number]   NDC: 70263-785-88

## 2020-03-22 ENCOUNTER — Other Ambulatory Visit: Payer: Self-pay | Admitting: Physical Medicine & Rehabilitation

## 2020-03-22 DIAGNOSIS — S069X0S Unspecified intracranial injury without loss of consciousness, sequela: Secondary | ICD-10-CM

## 2020-03-22 DIAGNOSIS — F5101 Primary insomnia: Secondary | ICD-10-CM

## 2020-03-22 DIAGNOSIS — F0781 Postconcussional syndrome: Secondary | ICD-10-CM

## 2020-03-25 ENCOUNTER — Other Ambulatory Visit: Payer: Self-pay | Admitting: Physical Medicine & Rehabilitation

## 2020-03-25 ENCOUNTER — Telehealth: Payer: Self-pay | Admitting: Physical Medicine & Rehabilitation

## 2020-03-25 DIAGNOSIS — F068 Other specified mental disorders due to known physiological condition: Secondary | ICD-10-CM

## 2020-03-25 DIAGNOSIS — F0781 Postconcussional syndrome: Secondary | ICD-10-CM

## 2020-03-25 NOTE — Telephone Encounter (Signed)
Debbie (pt's mom)  called about issue with getting Belsomra refilled at [facility], she wasn't sure if it needed prior auth or not.  Please call patient's mother.

## 2020-03-26 NOTE — Telephone Encounter (Signed)
[facility] MOm phone [phone] called regardin son's meds - please return her call.

## 2020-03-26 NOTE — Telephone Encounter (Signed)
Notified Mrs [place] that [place] was submitted, and we would let her know when approval comes through.Marland Kitchen

## 2020-03-26 NOTE — Telephone Encounter (Signed)
Rec'd incoming fax, Belsomra approved.  03/26/2020-09/22/2020  Patient notified

## 2020-04-04 ENCOUNTER — Ambulatory Visit (INDEPENDENT_AMBULATORY_CARE_PROVIDER_SITE_OTHER): Payer: Medicaid Other | Admitting: Plastic Surgery

## 2020-04-04 ENCOUNTER — Other Ambulatory Visit: Payer: Self-pay

## 2020-04-04 ENCOUNTER — Encounter: Payer: Self-pay | Admitting: Plastic Surgery

## 2020-04-04 VITALS — BP 122/76 | HR 69 | Temp 97.8°F | Ht 73.0 in | Wt 158.8 lb

## 2020-04-04 DIAGNOSIS — L989 Disorder of the skin and subcutaneous tissue, unspecified: Secondary | ICD-10-CM

## 2020-04-04 NOTE — H&P (View-Only) (Signed)
   Referring Provider Badger, Michael C, MD [address] [place],  [place] [address]   CC:  Chief Complaint  Patient presents with  . 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 male.  HPI: Patient presents with 3 separate cystic lesions on the scalp.  They have been present for a number of years and are getting bigger.  They intermittently itch.  He would like to have them removed.  They are all on the right side of his scalp one in the frontal area 1 in the parietal area and one further off laterally towards the right side.  Allergies  Allergen Reactions  . Morphine And Related Itching    Itch   . Vicodin [Hydrocodone-Acetaminophen] Itching and Other (See Comments)    Itch  Unknown Itch     Outpatient Encounter Medications as of 04/04/2020  Medication Sig  . amphetamine-dextroamphetamine (ADDERALL) 7.5 MG tablet Take 1-2 tablets by mouth 2 (two) times daily.  . BELSOMRA 10 MG TABS TAKE 1 TABLET BY MOUTH AT BEDTIME  . cholecalciferol (VITAMIN D) 1000 units tablet Take 1,000 Units by mouth daily.  . clonazePAM (KLONOPIN) 1 MG tablet TAKE 1 TABLET BY MOUTH AT BEDTIME  . fish oil-omega-3 fatty acids 1000 MG capsule Take 2 g by mouth daily.  . Multiple Vitamin (MULTIVITAMIN) capsule Take 1 capsule by mouth daily.  . traZODone (DESYREL) 100 MG tablet TAKE 3 TABLETS(300 MG) BY MOUTH AT BEDTIME  . [DISCONTINUED] famotidine (PEPCID) 20 MG tablet Take 1 tablet (20 mg total) by mouth 2 (two) times daily.   No facility-administered encounter medications on file as of 04/04/2020.     Past Medical History:  Diagnosis Date  . Traumatic brain injury (HCC)    2009    Past Surgical History:  Procedure Laterality Date  . FEMUR FRACTURE SURGERY    . NOSE SURGERY    . WISDOM TOOTH EXTRACTION      No family history on file.  Social History   Social History Narrative  . Not on file     Review of Systems General: Denies fevers,  chills, weight loss CV: Denies chest pain, shortness of breath, palpitations  Physical Exam Vitals with BMI 04/04/2020 11/22/2019 06/11/2019  Height 6' 1" 6' 2" -  Weight 158 lbs 13 oz 160 lbs 164 lbs  BMI 20.96 20.53 -  Systolic 122 116 107  Diastolic 76 73 70  Pulse 69 80 81    General:  No acute distress,  Alert and oriented, Non-Toxic, Normal speech and affect Examination of the scalp shows 3 cystic lesions.  There are each approximately 3 cm in diameter.  The skin is atrophied superficial to each of them.  They feel like mobile epidermal inclusion cyst.  Assessment/Plan Patient presents with 3 separate cystic lesions on the scalp.  We discussed excision.  We discussed the risk that include bleeding, infection, damage to surrounding structures, need for additional procedures.  We discussed the potential for recurrence.  We will plan to set this up for the operating room and will request MAC anesthesia.  Shalice Woodring S Jermiyah Ricotta 04/04/2020, 11:55 AM      

## 2020-04-04 NOTE — Progress Notes (Signed)
   Referring Provider Chesley Noon, MD [place],  [place] [address]   CC:  Chief Complaint  Patient presents with  . 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 male.  HPI: Patient presents with 3 separate cystic lesions on the scalp.  They have been present for a number of years and are getting bigger.  They intermittently itch.  He would like to have them removed.  They are all on the right side of his scalp one in the frontal area 1 in the parietal area and one further off laterally towards the right side.  Allergies  Allergen Reactions  . Morphine And Related Itching    Itch   . Vicodin [Hydrocodone-Acetaminophen] Itching and Other (See Comments)    Itch  Unknown Itch     Outpatient Encounter Medications as of 04/04/2020  Medication Sig  . amphetamine-dextroamphetamine (ADDERALL) 7.5 MG tablet Take 1-2 tablets by mouth 2 (two) times daily.  . BELSOMRA 10 MG TABS TAKE 1 TABLET BY MOUTH AT BEDTIME  . cholecalciferol (VITAMIN D) 1000 units tablet Take 1,000 Units by mouth daily.  . clonazePAM (KLONOPIN) 1 MG tablet TAKE 1 TABLET BY MOUTH AT BEDTIME  . fish oil-omega-3 fatty acids 1000 MG capsule Take 2 g by mouth daily.  . Multiple Vitamin (MULTIVITAMIN) capsule Take 1 capsule by mouth daily.  . traZODone (DESYREL) 100 MG tablet TAKE 3 TABLETS(300 MG) BY MOUTH AT BEDTIME  . [DISCONTINUED] famotidine (PEPCID) 20 MG tablet Take 1 tablet (20 mg total) by mouth 2 (two) times daily.   No facility-administered encounter medications on file as of 04/04/2020.     Past Medical History:  Diagnosis Date  . Traumatic brain injury [facility])    2009    Past Surgical History:  Procedure Laterality Date  . FEMUR FRACTURE SURGERY    . NOSE SURGERY    . WISDOM TOOTH EXTRACTION      No family history on file.  Social History   Social History Narrative  . Not on file     Review of Systems General: Denies fevers,  chills, weight loss CV: Denies chest pain, shortness of breath, palpitations  Physical Exam Vitals with BMI 04/04/2020 11/22/2019 06/11/2019  Height _0  _1  -  Weight 158 lbs 13 oz 160 lbs 164 lbs  BMI 23.55 73.22 -  Systolic 025 427 062  Diastolic 76 73 70  Pulse 69 80 81    General:  No acute distress,  Alert and oriented, Non-Toxic, Normal speech and affect Examination of the scalp shows 3 cystic lesions.  There are each approximately 3 cm in diameter.  The skin is atrophied superficial to each of them.  They feel like mobile epidermal inclusion cyst.  Assessment/Plan Patient presents with 3 separate cystic lesions on the scalp.  We discussed excision.  We discussed the risk that include bleeding, infection, damage to surrounding structures, need for additional procedures.  We discussed the potential for recurrence.  We will plan to set this up for the operating room and will request MAC anesthesia.  Cindra Presume 04/04/2020, 11:55 AM

## 2020-04-11 ENCOUNTER — Institutional Professional Consult (permissible substitution): Payer: Medicaid Other | Admitting: Plastic Surgery

## 2020-04-22 ENCOUNTER — Encounter (HOSPITAL_BASED_OUTPATIENT_CLINIC_OR_DEPARTMENT_OTHER): Payer: Self-pay | Admitting: Plastic Surgery

## 2020-04-22 ENCOUNTER — Other Ambulatory Visit: Payer: Self-pay

## 2020-04-25 ENCOUNTER — Other Ambulatory Visit (HOSPITAL_COMMUNITY)
Admission: RE | Admit: 2020-04-25 | Discharge: 2020-04-25 | Disposition: A | Discharge status: Home / Self Care | Payer: Medicaid Other | Source: Ambulatory Visit | Attending: Plastic Surgery | Admitting: Plastic Surgery

## 2020-04-25 DIAGNOSIS — Z01812 Encounter for preprocedural laboratory examination: Secondary | ICD-10-CM | POA: Diagnosis not present

## 2020-04-25 DIAGNOSIS — Z20822 Contact with and (suspected) exposure to covid-19: Secondary | ICD-10-CM | POA: Insufficient documentation

## 2020-04-25 LAB — SARS CORONAVIRUS 2 (TAT 6-24 HRS): SARS Coronavirus 2: NEGATIVE

## 2020-04-29 ENCOUNTER — Ambulatory Visit (HOSPITAL_BASED_OUTPATIENT_CLINIC_OR_DEPARTMENT_OTHER): Payer: Medicaid Other | Admitting: Certified Registered Nurse Anesthetist

## 2020-04-29 ENCOUNTER — Ambulatory Visit (HOSPITAL_BASED_OUTPATIENT_CLINIC_OR_DEPARTMENT_OTHER)
Admission: RE | Admit: 2020-04-29 | Discharge: 2020-04-29 | Disposition: A | Discharge status: Home / Self Care | Payer: Medicaid Other | Attending: Plastic Surgery | Admitting: Plastic Surgery

## 2020-04-29 ENCOUNTER — Encounter (HOSPITAL_BASED_OUTPATIENT_CLINIC_OR_DEPARTMENT_OTHER): Payer: Self-pay | Admitting: Plastic Surgery

## 2020-04-29 ENCOUNTER — Encounter (HOSPITAL_BASED_OUTPATIENT_CLINIC_OR_DEPARTMENT_OTHER)
Admission: RE | Disposition: A | Discharge status: Home / Self Care | Payer: Self-pay | Source: Home / Self Care | Attending: Plastic Surgery

## 2020-04-29 ENCOUNTER — Telehealth (INDEPENDENT_AMBULATORY_CARE_PROVIDER_SITE_OTHER): Payer: Medicaid Other | Admitting: Plastic Surgery

## 2020-04-29 DIAGNOSIS — L729 Follicular cyst of the skin and subcutaneous tissue, unspecified: Secondary | ICD-10-CM

## 2020-04-29 DIAGNOSIS — Z79899 Other long term (current) drug therapy: Secondary | ICD-10-CM | POA: Insufficient documentation

## 2020-04-29 DIAGNOSIS — L7211 Pilar cyst: Secondary | ICD-10-CM

## 2020-04-29 DIAGNOSIS — G4730 Sleep apnea, unspecified: Secondary | ICD-10-CM | POA: Diagnosis not present

## 2020-04-29 DIAGNOSIS — L989 Disorder of the skin and subcutaneous tissue, unspecified: Secondary | ICD-10-CM

## 2020-04-29 DIAGNOSIS — Z885 Allergy status to narcotic agent status: Secondary | ICD-10-CM | POA: Diagnosis not present

## 2020-04-29 DIAGNOSIS — Z87820 Personal history of traumatic brain injury: Secondary | ICD-10-CM | POA: Insufficient documentation

## 2020-04-29 HISTORY — DX: Attention and concentration deficit: R41.840

## 2020-04-29 HISTORY — DX: Insomnia, unspecified: G47.00

## 2020-04-29 HISTORY — PX: LESION EXCISION WITH COMPLEX REPAIR: SHX6700

## 2020-04-29 HISTORY — DX: Sleep apnea, unspecified: G47.30

## 2020-04-29 SURGERY — LESION EXCISION WITH COMPLEX REPAIR
Anesthesia: Monitor Anesthesia Care | Site: Scalp | Laterality: Right

## 2020-04-29 MED ORDER — OXYCODONE HCL 5 MG/5ML PO SOLN: 5.0000 mg | Freq: Once | ORAL | Status: DC | PRN

## 2020-04-29 MED ORDER — TRAMADOL HCL 50 MG PO TABS: 50.0000 mg | ORAL_TABLET | Freq: Three times a day (TID) | ORAL | 0 refills | Status: AC | PRN

## 2020-04-29 MED ORDER — LIDOCAINE-EPINEPHRINE 1 %-1:100000 IJ SOLN
INTRAMUSCULAR | Status: DC | PRN
  Administered 2020-04-29: 35 mL

## 2020-04-29 MED ORDER — BACITRACIN 500 UNIT/GM EX OINT
TOPICAL_OINTMENT | CUTANEOUS | Status: DC | PRN
  Administered 2020-04-29: 1 via TOPICAL

## 2020-04-29 MED ORDER — MIDAZOLAM HCL 5 MG/5ML IJ SOLN
INTRAMUSCULAR | Status: DC | PRN
  Administered 2020-04-29: 2 mg via INTRAVENOUS

## 2020-04-29 MED ORDER — HYDROMORPHONE HCL 1 MG/ML IJ SOLN: 0.2500 mg | INTRAMUSCULAR | Status: DC | PRN

## 2020-04-29 MED ORDER — ONDANSETRON HCL 4 MG/2ML IJ SOLN
INTRAMUSCULAR | Status: DC | PRN
  Administered 2020-04-29: 4 mg via INTRAVENOUS

## 2020-04-29 MED ORDER — MIDAZOLAM HCL 2 MG/2ML IJ SOLN
INTRAMUSCULAR | Status: AC
  Filled 2020-04-29: qty 2

## 2020-04-29 MED ORDER — LACTATED RINGERS IV SOLN: INTRAVENOUS | Status: DC

## 2020-04-29 MED ORDER — OXYCODONE HCL 5 MG PO TABS: 5.0000 mg | ORAL_TABLET | Freq: Once | ORAL | Status: DC | PRN

## 2020-04-29 MED ORDER — PROPOFOL 500 MG/50ML IV EMUL
INTRAVENOUS | Status: AC
  Filled 2020-04-29: qty 50

## 2020-04-29 MED ORDER — LIDOCAINE 2% (20 MG/ML) 5 ML SYRINGE
INTRAMUSCULAR | Status: DC | PRN
  Administered 2020-04-29: 40 mg via INTRAVENOUS

## 2020-04-29 MED ORDER — LIDOCAINE 2% (20 MG/ML) 5 ML SYRINGE
INTRAMUSCULAR | Status: AC
  Filled 2020-04-29: qty 5

## 2020-04-29 MED ORDER — CEFAZOLIN SODIUM-DEXTROSE 2-4 GM/100ML-% IV SOLN
2.0000 g | INTRAVENOUS | Status: AC
  Administered 2020-04-29: 2 g via INTRAVENOUS

## 2020-04-29 MED ORDER — PROPOFOL 500 MG/50ML IV EMUL
INTRAVENOUS | Status: DC | PRN
  Administered 2020-04-29: 150 ug/kg/min via INTRAVENOUS

## 2020-04-29 MED ORDER — PROPOFOL 10 MG/ML IV BOLUS
INTRAVENOUS | Status: DC | PRN
  Administered 2020-04-29: 30 mg via INTRAVENOUS

## 2020-04-29 MED ORDER — PROPOFOL 10 MG/ML IV BOLUS
INTRAVENOUS | Status: AC
  Filled 2020-04-29: qty 20

## 2020-04-29 MED ORDER — PROMETHAZINE HCL 25 MG/ML IJ SOLN: 6.2500 mg | INTRAMUSCULAR | Status: DC | PRN

## 2020-04-29 MED ORDER — FENTANYL CITRATE (PF) 100 MCG/2ML IJ SOLN
INTRAMUSCULAR | Status: DC | PRN
  Administered 2020-04-29 (×2): 50 ug via INTRAVENOUS

## 2020-04-29 MED ORDER — CEFAZOLIN SODIUM-DEXTROSE 2-4 GM/100ML-% IV SOLN
INTRAVENOUS | Status: AC
  Filled 2020-04-29: qty 100

## 2020-04-29 MED ORDER — FENTANYL CITRATE (PF) 100 MCG/2ML IJ SOLN
INTRAMUSCULAR | Status: AC
  Filled 2020-04-29: qty 2

## 2020-04-29 MED ORDER — ONDANSETRON HCL 4 MG/2ML IJ SOLN
INTRAMUSCULAR | Status: AC
  Filled 2020-04-29: qty 2

## 2020-04-29 SURGICAL SUPPLY — 68 items
ADH SKN CLS APL DERMABOND .7 (GAUZE/BANDAGES/DRESSINGS)
APL PRP STRL LF DISP 70% ISPRP (MISCELLANEOUS)
APL SKNCLS STERI-STRIP NONHPOA (GAUZE/BANDAGES/DRESSINGS)
BAND INSRT 18 STRL LF DISP RB (MISCELLANEOUS)
BAND RUBBER #18 3X1/16 STRL (MISCELLANEOUS) IMPLANT
BENZOIN TINCTURE PRP APPL 2/3 (GAUZE/BANDAGES/DRESSINGS) IMPLANT
BLADE CLIPPER SURG (BLADE) IMPLANT
BLADE SURG 15 STRL LF DISP TIS (BLADE) ×1 IMPLANT
BLADE SURG 15 STRL SS (BLADE) ×3
CANISTER SUCT 1200ML W/VALVE (MISCELLANEOUS) ×3 IMPLANT
CHLORAPREP W/TINT 26 (MISCELLANEOUS) IMPLANT
COVER BACK TABLE 60X90IN (DRAPES) ×3 IMPLANT
COVER MAYO STAND STRL (DRAPES) ×3 IMPLANT
COVER WAND RF STERILE (DRAPES) IMPLANT
DERMABOND ADVANCED (GAUZE/BANDAGES/DRESSINGS)
DERMABOND ADVANCED .7 DNX12 (GAUZE/BANDAGES/DRESSINGS) IMPLANT
DRAPE U-SHAPE 76X120 STRL (DRAPES) ×3 IMPLANT
DRAPE UTILITY XL STRL (DRAPES) IMPLANT
DRSG ADAPTIC 3X8 NADH LF (GAUZE/BANDAGES/DRESSINGS) ×2 IMPLANT
DRSG PAD ABDOMINAL 8X10 ST (GAUZE/BANDAGES/DRESSINGS) ×2 IMPLANT
DRSG TELFA 3X8 NADH (GAUZE/BANDAGES/DRESSINGS) IMPLANT
ELECT COATED BLADE 2.86 ST (ELECTRODE) ×2 IMPLANT
ELECT NDL BLADE 2-5/6 (NEEDLE) ×1 IMPLANT
ELECT NEEDLE BLADE 2-5/6 (NEEDLE) IMPLANT
ELECT REM PT RETURN 9FT ADLT (ELECTROSURGICAL) ×3
ELECTRODE REM PT RTRN 9FT ADLT (ELECTROSURGICAL) ×1 IMPLANT
GAUZE SPONGE 4X4 12PLY STRL LF (GAUZE/BANDAGES/DRESSINGS) ×4 IMPLANT
GAUZE XEROFORM 1X8 LF (GAUZE/BANDAGES/DRESSINGS) IMPLANT
GLOVE BIOGEL M STRL SZ7.5 (GLOVE) ×3 IMPLANT
GLOVE BIOGEL PI IND STRL 6.5 (GLOVE) IMPLANT
GLOVE BIOGEL PI IND STRL 8 (GLOVE) ×1 IMPLANT
GLOVE BIOGEL PI INDICATOR 6.5 (GLOVE) ×4
GLOVE BIOGEL PI INDICATOR 8 (GLOVE) ×2
GLOVE ECLIPSE 6.5 STRL STRAW (GLOVE) ×4 IMPLANT
GOWN STRL REUS W/ TWL LRG LVL3 (GOWN DISPOSABLE) ×2 IMPLANT
GOWN STRL REUS W/TWL LRG LVL3 (GOWN DISPOSABLE) ×9
NDL PRECISIONGLIDE 27X1.5 (NEEDLE) ×1 IMPLANT
NEEDLE PRECISIONGLIDE 27X1.5 (NEEDLE) ×3 IMPLANT
NS IRRIG 1000ML POUR BTL (IV SOLUTION) ×2 IMPLANT
PAD DRESSING TELFA 3X8 NADH (GAUZE/BANDAGES/DRESSINGS) IMPLANT
PENCIL SMOKE EVACUATOR (MISCELLANEOUS) ×3 IMPLANT
SET BASIN DAY SURGERY F.S. (CUSTOM PROCEDURE TRAY) ×3 IMPLANT
SHEET MEDIUM DRAPE 40X70 STRL (DRAPES) IMPLANT
SLEEVE SCD COMPRESS KNEE MED (MISCELLANEOUS) IMPLANT
SPONGE GAUZE 2X2 8PLY STER LF (GAUZE/BANDAGES/DRESSINGS)
SPONGE GAUZE 2X2 8PLY STRL LF (GAUZE/BANDAGES/DRESSINGS) IMPLANT
SPONGE LAP 18X18 RF (DISPOSABLE) IMPLANT
STAPLER VISISTAT 35W (STAPLE) ×1 IMPLANT
STOCKINETTE 6  STRL (DRAPES) ×3
STOCKINETTE 6 STRL (DRAPES) IMPLANT
SUCTION FRAZIER HANDLE 10FR (MISCELLANEOUS) ×3
SUCTION TUBE FRAZIER 10FR DISP (MISCELLANEOUS) ×1 IMPLANT
SUT ETHILON 4 0 PS 2 18 (SUTURE) IMPLANT
SUT MNCRL AB 3-0 PS2 18 (SUTURE) ×4 IMPLANT
SUT MNCRL AB 4-0 PS2 18 (SUTURE) IMPLANT
SUT MON AB 5-0 P3 18 (SUTURE) IMPLANT
SUT PDS 3-0 CT2 (SUTURE) ×3
SUT PDS II 3-0 CT2 27 ABS (SUTURE) IMPLANT
SUT PROLENE 5 0 P 3 (SUTURE) IMPLANT
SUT VICRYL 4-0 PS2 18IN ABS (SUTURE) IMPLANT
SWAB COLLECTION DEVICE MRSA (MISCELLANEOUS) IMPLANT
SWAB CULTURE ESWAB REG 1ML (MISCELLANEOUS) IMPLANT
SYR BULB EAR ULCER 3OZ GRN STR (SYRINGE) ×2 IMPLANT
SYR CONTROL 10ML LL (SYRINGE) ×3 IMPLANT
TOWEL GREEN STERILE FF (TOWEL DISPOSABLE) ×3 IMPLANT
TRAY DSU PREP LF (CUSTOM PROCEDURE TRAY) ×2 IMPLANT
TUBE CONNECTING 20'X1/4 (TUBING) ×1
TUBE CONNECTING 20X1/4 (TUBING) ×1 IMPLANT

## 2020-04-29 NOTE — Brief Op Note (Signed)
04/29/2020  11:17 AM  PATIENT:  Frank Frye  34 y.o. male  PRE-OPERATIVE DIAGNOSIS:  Skin Lesion  POST-OPERATIVE DIAGNOSIS:  Scalp cysts  PROCEDURE:  Procedure(s) with comments: Excision of 3 scalp cysts with complex closure (Right) - 45 min, please  SURGEON:  Surgeon(s) and Role:    * Wiktoria Hemrick, Wendy Poet, MD - Primary  PHYSICIAN ASSISTANT: Joni Fears, PA  ASSISTANTS: none   ANESTHESIA:   none  EBL:  10   BLOOD ADMINISTERED:none  DRAINS: none   LOCAL MEDICATIONS USED:  LIDOCAINE   SPECIMEN:  Source of Specimen:  right scalp cysts  DISPOSITION OF SPECIMEN:  PATHOLOGY  COUNTS:  YES  TOURNIQUET:  * No tourniquets in log *  DICTATION: .Dragon Dictation  PLAN OF CARE: Discharge to home after PACU  PATIENT DISPOSITION:  PACU - hemodynamically stable.   Delay start of Pharmacological VTE agent (>24hrs) due to surgical blood loss or risk of bleeding: not applicable

## 2020-04-29 NOTE — Discharge Instructions (Addendum)
Activity: As tolerated, but avoid strenuous activity until follow up visit.  Diet: Regular  Wound Care: Keep dressing clean & dry for 3 days.  After that you can shower normally.  Redress the wound as needed for comfort.  Special Instructions:  Call our office if any unusual problems occur such as pain, excessive bleeding, unrelieved nausea/vomiting, fever &/or chills.  Follow-up appointment: Scheduled for 7/7.  Pain Management Plan: 1) Ibuprofen 800 mg every 6 hours          If still having pain... Add 2) Tylenol 500 mg every 6 hours         You may alternate between Ibuprofen and Tylenol taking one every 3 hours if desired. May use ice if desired.    Post Anesthesia Home Care Instructions  Activity: Get plenty of rest for the remainder of the day. A responsible individual must stay with you for 24 hours following the procedure.  For the next 24 hours, DO NOT: -Drive a car -Advertising copywriter -Drink alcoholic beverages -Take any medication unless instructed by your physician -Make any legal decisions or sign important papers.  Meals: Start with liquid foods such as gelatin or soup. Progress to regular foods as tolerated. Avoid greasy, spicy, heavy foods. If nausea and/or vomiting occur, drink only clear liquids until the nausea and/or vomiting subsides. Call your physician if vomiting continues.  Special Instructions/Symptoms: Your throat may feel dry or sore from the anesthesia or the breathing tube placed in your throat during surgery. If this causes discomfort, gargle with warm salt water. The discomfort should disappear within 24 hours.  If you had a scopolamine patch placed behind your ear for the management of post- operative nausea and/or vomiting:  1. The medication in the patch is effective for 72 hours, after which it should be removed.  Wrap patch in a tissue and discard in the trash. Wash hands thoroughly with soap and water. 2. You may remove the patch earlier  than 72 hours if you experience unpleasant side effects which may include dry mouth, dizziness or visual disturbances. 3. Avoid touching the patch. Wash your hands with soap and water after contact with the patch.

## 2020-04-29 NOTE — Anesthesia Postprocedure Evaluation (Signed)
Anesthesia Post Note  Patient: Frank Frye  Procedure(s) Performed: Excision of 3 scalp cysts with complex closure (Right Scalp)     Patient location during evaluation: PACU Anesthesia Type: MAC Level of consciousness: awake and alert Pain management: pain level controlled Vital Signs Assessment: post-procedure vital signs reviewed and stable Respiratory status: spontaneous breathing, nonlabored ventilation and respiratory function stable Cardiovascular status: blood pressure returned to baseline and stable Postop Assessment: no apparent nausea or vomiting Anesthetic complications: no   No complications documented.  Last Vitals:  Vitals:   04/29/20 1200 04/29/20 1224  BP: 118/78 121/82  Pulse: 62 80  Resp: 13 16  Temp:  36.7 C  SpO2: 99% 100%    Last Pain:  Vitals:   04/29/20 1224  TempSrc:   PainSc: 0-No pain                 Lynda Rainwater

## 2020-04-29 NOTE — Interval H&P Note (Signed)
History and Physical Interval Note:  04/29/2020 10:04 AM  [facility]  has presented today for surgery, with the diagnosis of Skin Lesion.  The various methods of treatment have been discussed with the patient and family. After consideration of risks, benefits and other options for treatment, the patient has consented to  Procedure(s) with comments: Excision of 3 scalp cysts with complex closure (N/A) - 45 min, please as a surgical intervention.  The patient's history has been reviewed, patient examined, no change in status, stable for surgery.  I have reviewed the patient's chart and labs.  Questions were answered to the patient's satisfaction.     Frank Frye

## 2020-04-29 NOTE — Telephone Encounter (Signed)
Spoke with Patient's mother. Patient should use Tylenol 500 mg every 6 hours and Ibuprofen 800 mg every 6 hours as first line treatment for pain. If pain is not well controlled with this then mother requested tramadol. Cautioned not to use tramadol with the belsomra as it may cause too much sedation.  Tramadol sent to pharmacy.

## 2020-04-29 NOTE — Transfer of Care (Signed)
Immediate Anesthesia Transfer of Care Note  Patient: Frank Frye  Procedure(s) Performed: Excision of 3 scalp cysts with complex closure (Right Scalp)  Patient Location: PACU  Anesthesia Type:MAC  Level of Consciousness: awake, alert  and oriented  Airway & Oxygen Therapy: Patient Spontanous Breathing and Patient connected to face mask oxygen  Post-op Assessment: Report given to RN and Post -op Vital signs reviewed and stable  Post vital signs: Reviewed and stable  Last Vitals:  Vitals Value Taken Time  BP 128/64 04/29/20 1119  Temp    Pulse 88 04/29/20 1120  Resp 17 04/29/20 1120  SpO2 100 % 04/29/20 1120  Vitals shown include unvalidated device data.  Last Pain:  Vitals:   04/29/20 0933  TempSrc: Oral  PainSc: 0-No pain         Complications: No complications documented.

## 2020-04-29 NOTE — Op Note (Signed)
Operative Note   DATE OF OPERATION: 04/29/2020  SURGICAL DEPARTMENT: Plastic Surgery  PREOPERATIVE DIAGNOSES: 3 right scalp cysts  POSTOPERATIVE DIAGNOSES:  same  PROCEDURE: 1.  Excision of 3 right scalp cysts measuring 3 cm each 2.  Complex closure of each site measuring 3 cm each  SURGEON: Talmadge Coventry, MD  ASSISTANT: Phoebe Sharps, PA The advanced practice practitioner (APP) assisted throughout the case.  The APP was essential in retraction and counter traction when needed to make the case progress smoothly.  This retraction and assistance made it possible to see the tissue plans for the procedure.  The assistance was needed for blood control, tissue re-approximation and assisted with closure of the incision site.  ANESTHESIA:  General.   COMPLICATIONS: None.   INDICATIONS FOR PROCEDURE:  The patient, Frank Frye is a 34 y.o. male born on 08-04-1986, is here for treatment of scalp cysts MRN: [id number]  CONSENT:  Informed consent was obtained directly from the patient. Risks, benefits and alternatives were fully discussed. Specific risks including but not limited to bleeding, infection, hematoma, seroma, scarring, pain, contracture, asymmetry, wound healing problems, and need for further surgery were all discussed. The patient did have an ample opportunity to have questions answered to satisfaction.   DESCRIPTION OF PROCEDURE:  The patient was taken to the operating room. SCDs were placed and Ancef antibiotics were given.  MAC anesthesia was administered.  The patient's operative site was prepped and draped in a sterile fashion. A time out was performed and all information was confirmed to be correct.  The areas were identified with palpation.  Lidocaine with epinephrine was injected around each site.  This was given time to work.  Each cyst was then excised with a 15 blade.  Hemostasis was obtained at each site.  Closure was then performed in a layered fashion after  undermining the skin circumferentially to advance for a tension-free closure.  Closure was done with a combination of buried Monocryl and interrupted Monocryl sutures for the skin.  Soft dressing was then applied.  Each of the 3 excision sites measured 3 cm.  Each complex closure measured 3 cm.  The patient tolerated the procedure well.  There were no complications. The patient was allowed to wake from anesthesia, extubated and taken to the recovery room in satisfactory condition.

## 2020-04-29 NOTE — Anesthesia Preprocedure Evaluation (Addendum)
Anesthesia Evaluation  Patient identified by MRN, date of birth, ID band Patient awake    Reviewed: Allergy & Precautions, NPO status , Patient's Chart, lab work & pertinent test results  Airway Mallampati: II  TM Distance: >3 FB Neck ROM: Full    Dental no notable dental hx.    Pulmonary sleep apnea ,    Pulmonary exam normal breath sounds clear to auscultation       Cardiovascular negative cardio ROS Normal cardiovascular exam Rhythm:Regular Rate:Normal     Neuro/Psych negative neurological ROS  negative psych ROS   GI/Hepatic negative GI ROS, Neg liver ROS,   Endo/Other  negative endocrine ROS  Renal/GU negative Renal ROS  negative genitourinary   Musculoskeletal negative musculoskeletal ROS (+)   Abdominal   Peds negative pediatric ROS (+)  Hematology negative hematology ROS (+)   Anesthesia Other Findings Hx of TBI  Reproductive/Obstetrics negative OB ROS                             Anesthesia Physical Anesthesia Plan  ASA: III  Anesthesia Plan: MAC   Post-op Pain Management:    Induction: Intravenous  PONV Risk Score and Plan: 1 and Ondansetron and Treatment may vary due to age or medical condition  Airway Management Planned: Simple Face Mask  Additional Equipment:   Intra-op Plan:   Post-operative Plan:   Informed Consent: I have reviewed the patients History and Physical, chart, labs and discussed the procedure including the risks, benefits and alternatives for the proposed anesthesia with the patient or authorized representative who has indicated his/her understanding and acceptance.     Dental advisory given  Plan Discussed with: CRNA  Anesthesia Plan Comments:        Anesthesia Quick Evaluation

## 2020-04-29 NOTE — Telephone Encounter (Signed)
Patient's mom, Eunice Blase, called to let Dr. Arita Miss know that the tylenol alone is not working for her son. She was told to give the office a call if it's not enough. Mom said he is allergic to vicodin. They use [facility] in [place]. Please call mom to advise either way. [phone]

## 2020-04-30 ENCOUNTER — Encounter (HOSPITAL_BASED_OUTPATIENT_CLINIC_OR_DEPARTMENT_OTHER): Payer: Self-pay | Admitting: Plastic Surgery

## 2020-04-30 LAB — SURGICAL PATHOLOGY

## 2020-05-08 ENCOUNTER — Ambulatory Visit (INDEPENDENT_AMBULATORY_CARE_PROVIDER_SITE_OTHER): Payer: Medicaid Other | Admitting: Plastic Surgery

## 2020-05-08 ENCOUNTER — Encounter: Payer: Self-pay | Admitting: Plastic Surgery

## 2020-05-08 ENCOUNTER — Other Ambulatory Visit: Payer: Self-pay

## 2020-05-08 VITALS — BP 116/75 | HR 70 | Temp 98.4°F

## 2020-05-08 DIAGNOSIS — L989 Disorder of the skin and subcutaneous tissue, unspecified: Secondary | ICD-10-CM

## 2020-05-08 NOTE — Progress Notes (Signed)
Patient is postop from excision of 3 scalp cyst.  He is overall doing well.  Pathology showed benign pilar cyst.  Wounds are healing fine.  Suture knots were clipped and removed.  We will plan to follow-up with him again as needed.

## 2020-05-09 ENCOUNTER — Encounter: Payer: Medicaid Other | Admitting: Plastic Surgery

## 2020-05-15 ENCOUNTER — Encounter: Payer: Medicaid Other | Attending: Physical Medicine & Rehabilitation | Admitting: Physical Medicine & Rehabilitation

## 2020-05-15 ENCOUNTER — Encounter: Payer: Self-pay | Admitting: Physical Medicine & Rehabilitation

## 2020-05-15 ENCOUNTER — Other Ambulatory Visit: Payer: Self-pay

## 2020-05-15 VITALS — BP 111/76 | HR 79 | Temp 98.5°F | Ht 73.0 in | Wt 155.0 lb

## 2020-05-15 DIAGNOSIS — S069X0S Unspecified intracranial injury without loss of consciousness, sequela: Secondary | ICD-10-CM | POA: Insufficient documentation

## 2020-05-15 DIAGNOSIS — F5101 Primary insomnia: Secondary | ICD-10-CM | POA: Insufficient documentation

## 2020-05-15 DIAGNOSIS — F0781 Postconcussional syndrome: Secondary | ICD-10-CM | POA: Diagnosis not present

## 2020-05-15 DIAGNOSIS — M47816 Spondylosis without myelopathy or radiculopathy, lumbar region: Secondary | ICD-10-CM | POA: Insufficient documentation

## 2020-05-15 DIAGNOSIS — G8929 Other chronic pain: Secondary | ICD-10-CM | POA: Insufficient documentation

## 2020-05-15 DIAGNOSIS — F068 Other specified mental disorders due to known physiological condition: Secondary | ICD-10-CM | POA: Diagnosis not present

## 2020-05-15 DIAGNOSIS — Z79899 Other long term (current) drug therapy: Secondary | ICD-10-CM | POA: Insufficient documentation

## 2020-05-15 DIAGNOSIS — M545 Low back pain, unspecified: Secondary | ICD-10-CM

## 2020-05-15 DIAGNOSIS — Z5181 Encounter for therapeutic drug level monitoring: Secondary | ICD-10-CM | POA: Diagnosis present

## 2020-05-15 MED ORDER — AMPHETAMINE-DEXTROAMPHETAMINE 15 MG PO TABS
15.0000 mg | ORAL_TABLET | Freq: Two times a day (BID) | ORAL | 0 refills | Status: DC
Start: 2020-05-15 — End: 2020-06-25

## 2020-05-15 MED ORDER — CLONAZEPAM 1 MG PO TABS: ORAL_TABLET | ORAL | 2 refills | Status: DC

## 2020-05-15 NOTE — Progress Notes (Signed)
Subjective:    Patient ID: Frank Frye, male    DOB: 09-06-1986, 34 y.o.   MRN: [id number]  HPI   Frank Frye is here in follow up of his TBI. He has been taking an art class and is looking into [employer] and has a small drone he has been playing with.  He reports that sleep can be an issue.  Is often difficult to fall asleep.  He takes his medications around 9 and then tries to fall asleep around 930.  He is often up for an hour to 2 hours afterwards.  He likes to read Susette Racer boys books but sometimes these engage his mind to the point where he stays awake.  Mom reports that he feels groggy in the mornings often.  The medication regimen includes Klonopin 1 mg at bedtime along with Belsomra 10 mg and trazodone 200 mg.  He recently had some scalp cyst removed by dermatology.   Pain Inventory Average Pain 0 Pain Right Now 0 My pain is aching  In the last 24 hours, has pain interfered with the following? General activity 2 Relation with others 1 Enjoyment of life 2 What TIME of day is your pain at its worst? Depends on the activity. Sleep (in general) Poor  Pain is worse with: bending Pain improves with: rest and medication Relief from Meds: 7  Mobility walk without assistance ability to climb steps?  yes do you drive?  yes  Function disabled: date disabled 01/21/2008 I need assistance with the following:  meal prep and Family helps out. Do you have any goals in this area?  yes  Neuro/Psych No problems in this area  Prior Studies Any changes since last visit?  no  Physicians involved in your care Any changes since last visit?  yes Dr. Abigail Miyamoto with [facility] at [facility]   History reviewed. No pertinent family history. Social History   Socioeconomic History  . Marital status: Single    Spouse name: Not on file  . Number of children: Not on file  . Years of education: Not on file  . Highest education level: Not on file  Occupational History  . Not on file  Tobacco  Use  . Smoking status: Never Smoker  . Smokeless tobacco: Never Used  Vaping Use  . Vaping Use: Never used  Substance and Sexual Activity  . Alcohol use: Yes    Comment: occ  . Drug use: Never  . Sexual activity: Not on file  Other Topics Concern  . Not on file  Social History Narrative  . Not on file   Social Determinants of Health   Financial Resource Strain:   . Difficulty of Paying Living Expenses:   Food Insecurity:   . Worried About Programme researcher, broadcasting/film/video in the Last Year:   . Barista in the Last Year:   Transportation Needs:   . Freight forwarder (Medical):   Marland Kitchen Lack of Transportation (Non-Medical):   Physical Activity:   . Days of Exercise per Week:   . Minutes of Exercise per Session:   Stress:   . Feeling of Stress :   Social Connections:   . Frequency of Communication with Friends and Family:   . Frequency of Social Gatherings with Friends and Family:   . Attends Religious Services:   . Active Member of Clubs or Organizations:   . Attends Banker Meetings:   Marland Kitchen Marital Status:    Past Surgical History:  Procedure  Laterality Date  . FEMUR FRACTURE SURGERY    . LESION EXCISION WITH COMPLEX REPAIR Right 04/29/2020   Procedure: Excision of 3 scalp cysts with complex closure;  Surgeon: Allena Napoleon, MD;  Location: [facility];  Service: Plastics;  Laterality: Right;  45 min, please  . NOSE SURGERY    . WISDOM TOOTH EXTRACTION     Past Medical History:  Diagnosis Date  . Attention and concentration deficit   . Insomnia   . Sleep apnea    mild no cpap  . Traumatic brain injury (HCC)    2009   BP 111/76   Pulse 79   Temp 98.5 F (36.9 C)   Ht 6\' 1"  (1.854 m)   Wt 155 lb (70.3 kg)   SpO2 96%   BMI 20.45 kg/m   Opioid Risk Score:   Fall Risk Score:  `1  Depression screen PHQ 2/9  Depression screen [facility] 2/9 05/17/2019 05/09/2018  Decreased Interest 0 0  Down, Depressed, Hopeless 0 0  PHQ - 2 Score 0 0   Review  of Systems  Constitutional: Positive for fatigue.  HENT: Negative.   Eyes: Negative.   Respiratory: Negative.   Cardiovascular: Negative.   Gastrointestinal: Negative.   Endocrine: Negative.   Genitourinary: Negative.   Musculoskeletal: Negative.   Skin: Negative.   Allergic/Immunologic: Negative.   Neurological: Negative.   Hematological: Negative.   Psychiatric/Behavioral: Positive for sleep disturbance.       Short term memory      Objective:   Physical Exam  General: No acute distress HEENT: EOMI, oral membranes moist Cards: reg rate  Chest: normal effort Abdomen: Soft, NT, ND Skin: dry, intact Extremities: no edema  Neuro:  Delayed processing and short-term memory deficits.  Musculoskeletal: Low back was not examined today.  Psych: flat but appropriate.      Assessment & Plan:   1. 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 Insomnia related to above.  Greatly improved 3. 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  Symptoms have resolved    Plan:   1. Klonopin for now at night.  RF today 2. Continue trazodone.   Reduced to 100 mg at bedtime 3. Belsomra 10mg  qhs has been effective for sleep.   Consider reducing dose as well but for now will stay as is.  Did discuss taking his medications earlier around 8 PM.   Discussed sleep hygiene at length once again. 5.  Adjust Adderall to 15 mg daily at breakfast and lunch.  Need to better normalize his day night cycle.  Some of his difficulty sleeping may be due to his attention problems that become worse when he is fatigued. 6. Continue VR efforts as able.  50 minutes of time was spent with the patient and mother in the office today.  I will see him back in about 2 months time.

## 2020-05-15 NOTE — Patient Instructions (Addendum)
SLEEP HYGIENE  ?8PM DOSING OF BELSOMRA, KLONOPIN, TRAZODONE  ? DECREASE TRAZODONE TO 100MG    READ A MAGAZINE OR YOUR IPAD ARTICLE. TRY TO WIND YOURSELF DOWN  TAKE YOUR ADDERALL AT 7AM AND 12 NOON MOST DAYS.

## 2020-05-20 ENCOUNTER — Other Ambulatory Visit: Payer: Self-pay | Admitting: Physical Medicine & Rehabilitation

## 2020-05-20 DIAGNOSIS — Z79899 Other long term (current) drug therapy: Secondary | ICD-10-CM

## 2020-05-20 DIAGNOSIS — F0781 Postconcussional syndrome: Secondary | ICD-10-CM

## 2020-05-20 DIAGNOSIS — M545 Low back pain, unspecified: Secondary | ICD-10-CM

## 2020-05-20 DIAGNOSIS — M47816 Spondylosis without myelopathy or radiculopathy, lumbar region: Secondary | ICD-10-CM

## 2020-05-20 DIAGNOSIS — Z5181 Encounter for therapeutic drug level monitoring: Secondary | ICD-10-CM

## 2020-05-20 DIAGNOSIS — S069X0S Unspecified intracranial injury without loss of consciousness, sequela: Secondary | ICD-10-CM

## 2020-05-20 DIAGNOSIS — F5101 Primary insomnia: Secondary | ICD-10-CM

## 2020-06-20 ENCOUNTER — Other Ambulatory Visit: Payer: Self-pay | Admitting: Physical Medicine & Rehabilitation

## 2020-06-20 DIAGNOSIS — F068 Other specified mental disorders due to known physiological condition: Secondary | ICD-10-CM

## 2020-06-20 DIAGNOSIS — F0781 Postconcussional syndrome: Secondary | ICD-10-CM

## 2020-06-20 DIAGNOSIS — F5101 Primary insomnia: Secondary | ICD-10-CM

## 2020-06-24 ENCOUNTER — Telehealth: Payer: Self-pay | Admitting: *Deleted

## 2020-06-24 NOTE — Telephone Encounter (Signed)
Mrs Cato called because DR Riley Kill increased the adderall to 2x day but did not increase the disp for #30 to #60.Marland Kitchen  He is out and they are going out of town and need this refilled.

## 2020-06-25 MED ORDER — AMPHETAMINE-DEXTROAMPHETAMINE 15 MG PO TABS: 15.0000 mg | ORAL_TABLET | Freq: Two times a day (BID) | ORAL | 0 refills | Status: DC

## 2020-06-25 MED ORDER — AMPHETAMINE-DEXTROAMPHETAMINE 15 MG PO TABS
15.0000 mg | ORAL_TABLET | Freq: Two times a day (BID) | ORAL | 0 refills | Status: DC
Start: 2020-06-25 — End: 2020-06-25

## 2020-06-25 NOTE — Telephone Encounter (Signed)
adderall refilled for this month and next.

## 2020-06-25 NOTE — Telephone Encounter (Signed)
Notified. 

## 2020-07-17 ENCOUNTER — Encounter: Payer: Self-pay | Admitting: Physical Medicine & Rehabilitation

## 2020-07-17 ENCOUNTER — Other Ambulatory Visit: Payer: Self-pay

## 2020-07-17 ENCOUNTER — Encounter: Payer: Medicaid Other | Attending: Physical Medicine & Rehabilitation | Admitting: Physical Medicine & Rehabilitation

## 2020-07-17 VITALS — BP 127/80 | HR 73 | Temp 98.0°F | Wt 156.0 lb

## 2020-07-17 DIAGNOSIS — F0781 Postconcussional syndrome: Secondary | ICD-10-CM

## 2020-07-17 DIAGNOSIS — F068 Other specified mental disorders due to known physiological condition: Secondary | ICD-10-CM | POA: Diagnosis present

## 2020-07-17 DIAGNOSIS — Z79899 Other long term (current) drug therapy: Secondary | ICD-10-CM | POA: Insufficient documentation

## 2020-07-17 DIAGNOSIS — S069X0S Unspecified intracranial injury without loss of consciousness, sequela: Secondary | ICD-10-CM

## 2020-07-17 DIAGNOSIS — M545 Low back pain: Secondary | ICD-10-CM | POA: Insufficient documentation

## 2020-07-17 DIAGNOSIS — G8929 Other chronic pain: Secondary | ICD-10-CM | POA: Diagnosis present

## 2020-07-17 DIAGNOSIS — Z5181 Encounter for therapeutic drug level monitoring: Secondary | ICD-10-CM | POA: Insufficient documentation

## 2020-07-17 DIAGNOSIS — F5101 Primary insomnia: Secondary | ICD-10-CM

## 2020-07-17 DIAGNOSIS — M47816 Spondylosis without myelopathy or radiculopathy, lumbar region: Secondary | ICD-10-CM | POA: Insufficient documentation

## 2020-07-17 DIAGNOSIS — R4189 Other symptoms and signs involving cognitive functions and awareness: Secondary | ICD-10-CM

## 2020-07-17 MED ORDER — BELSOMRA 10 MG PO TABS: 1.0000 | ORAL_TABLET | Freq: Every day | ORAL | 2 refills | Status: DC

## 2020-07-17 NOTE — Progress Notes (Signed)
Subjective:    Patient ID: Frank Frye, male    DOB: Mar 19, 1986, 34 y.o.   MRN: [id number]  HPI   Frank Frye is here in follow-up of his traumatic brain injury and associated deficits.  I last saw him in July. He has been working on fixing up a house at [employer] with his family.   He finds that he has been sleeping a bit better but often is going to sleep around 830 9:00.  Sometimes because of that sleep time it takes him a while to wind down.  Tells me that he reads to try to relax but ends up trying to rebound focusing in concentrating on what he is reading.  Usually wakes up around 9:00 in the morning.  He is hesitant to take the second Adderall because of his wake time.  He tells me he was on long-acting Adderall before did not like how it affected him.   Pain Inventory Average Pain 0 Pain Right Now 0 My pain is sometimes lower back pain. None today.  LOCATION OF PAIN  : Lower back pain.   BOWEL Number of stools per week: 7 or more Oral laxative use No  Type of laxative : None Enema or suppository use No  History of colostomy No  Incontinent No   BLADDER Normal In and out cath, frequency- none Able to self cath No need  Bladder incontinence No  Frequent urination No  Leakage with coughing No  Difficulty starting stream No  Incomplete bladder emptying No    Mobility walk without assistance ability to climb steps?  yes do you drive?  yes  Function disabled: date disabled 2009  Neuro/Psych No problems in this area  Prior Studies Any changes since last visit?  no  Physicians involved in your care Any changes since last visit?  yes Dr. Benjaman Kindler, Dr Joycelyn Rua PCP   History reviewed. No pertinent family history. Social History   Socioeconomic History  . Marital status: Single    Spouse name: Not on file  . Number of children: Not on file  . Years of education: Not on file  . Highest education level: Not on file  Occupational History  . Not on  file  Tobacco Use  . Smoking status: Never Smoker  . Smokeless tobacco: Never Used  Vaping Use  . Vaping Use: Never used  Substance and Sexual Activity  . Alcohol use: Yes    Comment: occ  . Drug use: Never  . Sexual activity: Not on file  Other Topics Concern  . Not on file  Social History Narrative  . Not on file   Social Determinants of Health   Financial Resource Strain:   . Difficulty of Paying Living Expenses: Not on file  Food Insecurity:   . Worried About Programme researcher, broadcasting/film/video in the Last Year: Not on file  . Ran Out of Food in the Last Year: Not on file  Transportation Needs:   . Lack of Transportation (Medical): Not on file  . Lack of Transportation (Non-Medical): Not on file  Physical Activity:   . Days of Exercise per Week: Not on file  . Minutes of Exercise per Session: Not on file  Stress:   . Feeling of Stress : Not on file  Social Connections:   . Frequency of Communication with Friends and Family: Not on file  . Frequency of Social Gatherings with Friends and Family: Not on file  . Attends Religious Services: Not  on file  . Active Member of Clubs or Organizations: Not on file  . Attends Banker Meetings: Not on file  . Marital Status: Not on file   Past Surgical History:  Procedure Laterality Date  . FEMUR FRACTURE SURGERY    . LESION EXCISION WITH COMPLEX REPAIR Right 04/29/2020   Procedure: Excision of 3 scalp cysts with complex closure;  Surgeon: Allena Napoleon, MD;  Location: [facility];  Service: Plastics;  Laterality: Right;  45 min, please  . NOSE SURGERY    . WISDOM TOOTH EXTRACTION     Past Medical History:  Diagnosis Date  . Attention and concentration deficit   . Insomnia   . Sleep apnea    mild no cpap  . Traumatic brain injury (HCC)    2009   BP 127/80   Pulse 73   Temp 98 F (36.7 C)   Wt 156 lb (70.8 kg)   SpO2 98%   BMI 20.58 kg/m   Opioid Risk Score:   Fall Risk Score:  `1  Depression  screen PHQ 2/9  Depression screen [facility] 2/9 05/15/2020 05/17/2019 05/09/2018  Decreased Interest 0 0 0  Down, Depressed, Hopeless 0 0 0  PHQ - 2 Score 0 0 0   Review of Systems  Constitutional: Positive for fatigue.  HENT: Negative.   Eyes: Negative.   Respiratory: Negative.   Cardiovascular: Negative.   Gastrointestinal: Negative.   Endocrine: Negative.   Genitourinary: Negative.   Musculoskeletal: Negative.   Skin: Negative.   Allergic/Immunologic: Negative.   Neurological: Negative.        Short term memory  Hematological: Negative.   Psychiatric/Behavioral: Positive for sleep disturbance.  All other systems reviewed and are negative.      Objective:   Physical Exam  General: No acute distress HEENT: EOMI, oral membranes moist Cards: reg rate  Chest: normal effort Abdomen: Soft, NT, ND Skin: dry, intact Extremities: no edema Neuro:  DELAYED processing and STM deficits. Slow to initiate.  Musculoskeletal: normal ROM  Psych: flat but appropriate.      Assessment & Plan:   1. 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 Insomnia related to above.  Greatly improved 3. 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  Symptoms have resolved    Plan:   1. Klonopin for now at night. RF today 2. Continue trazodone.   Reduced to 100 mg at bedtime 3. Belsomra 10mg  qhs has been effective for sleep.    -sleep hygiene reviewed again  -maintain physical activity/schedule during the day     4.  Adjust Adderall to 15 mg daily at breakfast and lunch.  Need to better normalize his day night cycle.       -I would like him to take it twice daily.  15 minutes of time was spent with the patient in discussion and review of his treatment regimen.  I will see him back in about 4 months time

## 2020-07-17 NOTE — Patient Instructions (Addendum)
WORK ON REGULAR SCHEDULE---WRITE IT OUT AND FOLLOW IT!

## 2020-08-20 ENCOUNTER — Other Ambulatory Visit: Payer: Self-pay | Admitting: Physical Medicine & Rehabilitation

## 2020-08-20 ENCOUNTER — Telehealth: Payer: Self-pay | Admitting: *Deleted

## 2020-08-20 DIAGNOSIS — S069X0S Unspecified intracranial injury without loss of consciousness, sequela: Secondary | ICD-10-CM

## 2020-08-20 DIAGNOSIS — Z5181 Encounter for therapeutic drug level monitoring: Secondary | ICD-10-CM

## 2020-08-20 DIAGNOSIS — F0781 Postconcussional syndrome: Secondary | ICD-10-CM

## 2020-08-20 DIAGNOSIS — Z79899 Other long term (current) drug therapy: Secondary | ICD-10-CM

## 2020-08-20 DIAGNOSIS — F5101 Primary insomnia: Secondary | ICD-10-CM

## 2020-08-20 DIAGNOSIS — G8929 Other chronic pain: Secondary | ICD-10-CM

## 2020-08-20 DIAGNOSIS — M47816 Spondylosis without myelopathy or radiculopathy, lumbar region: Secondary | ICD-10-CM

## 2020-08-20 NOTE — Telephone Encounter (Signed)
Calling about his clonazepam. It has been called to the pharmacy by Purvis Sheffield. Mr Cullom notified.

## 2020-10-17 ENCOUNTER — Telehealth: Payer: Self-pay | Admitting: *Deleted

## 2020-10-17 MED ORDER — AMPHETAMINE-DEXTROAMPHETAMINE 15 MG PO TABS: 15.0000 mg | ORAL_TABLET | Freq: Two times a day (BID) | ORAL | 0 refills | Status: DC

## 2020-10-17 NOTE — Telephone Encounter (Signed)
adderall refilled

## 2020-10-17 NOTE — Telephone Encounter (Signed)
Frank Frye called for a refill on his adderall.  Per PMP last filled 08/30/20.

## 2020-10-17 NOTE — Telephone Encounter (Signed)
Notified Terron.

## 2020-11-13 ENCOUNTER — Encounter: Payer: Self-pay | Admitting: Physical Medicine & Rehabilitation

## 2020-11-13 ENCOUNTER — Other Ambulatory Visit: Payer: Self-pay

## 2020-11-13 ENCOUNTER — Encounter: Payer: Medicaid Other | Attending: Physical Medicine & Rehabilitation | Admitting: Physical Medicine & Rehabilitation

## 2020-11-13 VITALS — BP 123/75 | HR 71 | Temp 98.3°F | Ht 73.0 in | Wt 156.3 lb

## 2020-11-13 DIAGNOSIS — F068 Other specified mental disorders due to known physiological condition: Secondary | ICD-10-CM | POA: Insufficient documentation

## 2020-11-13 DIAGNOSIS — E039 Hypothyroidism, unspecified: Secondary | ICD-10-CM | POA: Diagnosis present

## 2020-11-13 DIAGNOSIS — R7989 Other specified abnormal findings of blood chemistry: Secondary | ICD-10-CM | POA: Diagnosis present

## 2020-11-13 DIAGNOSIS — R5382 Chronic fatigue, unspecified: Secondary | ICD-10-CM | POA: Insufficient documentation

## 2020-11-13 DIAGNOSIS — E559 Vitamin D deficiency, unspecified: Secondary | ICD-10-CM | POA: Diagnosis present

## 2020-11-13 DIAGNOSIS — F0781 Postconcussional syndrome: Secondary | ICD-10-CM | POA: Insufficient documentation

## 2020-11-13 DIAGNOSIS — S069X0S Unspecified intracranial injury without loss of consciousness, sequela: Secondary | ICD-10-CM | POA: Insufficient documentation

## 2020-11-13 MED ORDER — MODAFINIL 100 MG PO TABS
100.0000 mg | ORAL_TABLET | Freq: Every day | ORAL | 3 refills | Status: DC
Start: 2020-11-13 — End: 2020-12-09

## 2020-11-13 NOTE — Progress Notes (Addendum)
Subjective:    Patient ID: Frank Frye, male    DOB: [birth year], 35 y.o.   MRN: [id number]  HPI  Frank Frye is here in follow-up of his traumatic brain injury and associated deficits.  I last saw him in September.  We adjusted his Adderall to daily at breakfast and lunch in efforts to better normalize his day night cycle.  Mom feels that the short acting Adderall has been more effective than the long-acting as the longer acting seem to have a residual effect in the evening.  He remains on Belsomra for sleep.   He continues to struggle with sleep and daytime wakefulness.  They made some adjustments with his meds recently without really much of a change including reducing Belsomra and adjusting trazodone down.  We talked at his last visit about reducing the trazodone to 100 mg.  He saw Frank Frye apparently who made some recommendations regarding his situation and suggested a trial of Provigil for daytime arousal.  He is working with a new job coach who has been working on finding him some new jobs. He had a list of them with him today  Pain Inventory Average Pain 2 Pain Right Now 0 My pain is intermittent  In the last 24 hours, has pain interfered with the following? General activity 2 Relation with others 1 Enjoyment of life 2 What TIME of day is your pain at its worst? evening Sleep (in general) Good  Pain is worse with: sitting Pain improves with: rest, heat/ice and therapy/exercise Relief from Meds: na  No family history on file. Social History   Socioeconomic History  . Marital status: Single    Spouse name: Not on file  . Number of children: Not on file  . Years of education: Not on file  . Highest education level: Not on file  Occupational History  . Not on file  Tobacco Use  . Smoking status: Never Smoker  . Smokeless tobacco: Never Used  Vaping Use  . Vaping Use: Never used  Substance and Sexual Activity  . Alcohol use: Yes    Comment: occ  . Drug use: Never   . Sexual activity: Not on file  Other Topics Concern  . Not on file  Social History Narrative  . Not on file   Social Determinants of Health   Financial Resource Strain: Not on file  Food Insecurity: Not on file  Transportation Needs: Not on file  Physical Activity: Not on file  Stress: Not on file  Social Connections: Not on file   Past Surgical History:  Procedure Laterality Date  . FEMUR FRACTURE SURGERY    . LESION EXCISION WITH COMPLEX REPAIR Right 04/29/2020   Procedure: Excision of 3 scalp cysts with complex closure;  Surgeon: Allena Napoleon, MD;  Location: [facility];  Service: Plastics;  Laterality: Right;  45 min, please  . NOSE SURGERY    . WISDOM TOOTH EXTRACTION     Past Surgical History:  Procedure Laterality Date  . FEMUR FRACTURE SURGERY    . LESION EXCISION WITH COMPLEX REPAIR Right 04/29/2020   Procedure: Excision of 3 scalp cysts with complex closure;  Surgeon: Allena Napoleon, MD;  Location: [facility];  Service: Plastics;  Laterality: Right;  45 min, please  . NOSE SURGERY    . WISDOM TOOTH EXTRACTION     Past Medical History:  Diagnosis Date  . Attention and concentration deficit   . Insomnia   . Sleep  apnea    mild no cpap  . Traumatic brain injury (HCC)    2009   BP 123/75   Pulse 71   Temp 98.3 F (36.8 C)   Ht 6\' 1"  (1.854 m)   Wt 156 lb 4.8 oz (70.9 kg)   SpO2 97%   BMI 20.62 kg/m   Opioid Risk Score:   Fall Risk Score:  `1  Depression screen PHQ 2/9  Depression screen [facility] 2/9 07/17/2020 05/15/2020 05/17/2019 05/09/2018  Decreased Interest 0 0 0 0  Down, Depressed, Hopeless 0 0 0 0  PHQ - 2 Score 0 0 0 0    Review of Systems  Musculoskeletal: Positive for back pain.  All other systems reviewed and are negative.      Objective:   Physical Exam General: No acute distress HEENT: EOMI, oral membranes moist Cards: reg rate  Chest: normal effort Abdomen: Soft, NT, ND Skin: dry,  intact Extremities: no edema Neuro:  DELAYED processing and STM deficits. Slow to initiate.  Musculoskeletal: normal ROM  Psych: flat but appropriate.      Assessment & Plan:   1. 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 Insomnia related to above.  Greatly improved 3. 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  Symptoms have resolved    Plan:   1. Klonopin for now at night. RF today 2. Continue trazodone at 200mg ---reduce to 100mg .  Discussed with mom and patient 3. Belsomra 10mg  qhs has been the most effective agent for sleep for Frank Frye.              -sleep hygiene reviewed again            -Unfortunately he is not active enough.  The day.  He is very sedentary.  He admits he is playing 03-28-1986 and not getting out of the house much.  I challenged him to an hour of physical activity each day outside in the sun when at all possible weather permitting.  -Also encouraged pursuance of part-time work                           4.  Hold adderall for now                              5. Trial of provigil 100mg  daily (they are able to purchase through [facility]).  Can increase to 200 mg after 2 weeks if ineffective.  The issue with Provigil is that it tends not to have a profound effect on attention and concentration which are problems for Frank Frye.  Also has been prohibitive from a cost perspective for patients in the past   6. Ordered a battery of labs including thryoid pane, b12,b1, b6, testosterone, IGF-1, am cortisol, cbc and cmet to look for a source of ongoing fatigue. These haven't been checked for some time  15 minutes of time was spent with the patient in discussion and review of his treatment regimen.  I will see him back in about 4 months time

## 2020-11-13 NOTE — Patient Instructions (Signed)
GET OUTSIDE, I WANT TO GET AN HOUR OF EXERCISE AND SUN EACH DAY UNLESS THERE IS A BLIZZARD OR RAINSTORM THE ENTIRE DAY!

## 2020-11-15 NOTE — Addendum Note (Signed)
Addended by: Faith Rogue T on: 11/15/2020 12:05 PM   Modules accepted: Orders

## 2020-11-19 ENCOUNTER — Other Ambulatory Visit: Payer: Self-pay | Admitting: Physical Medicine & Rehabilitation

## 2020-11-19 DIAGNOSIS — S069X0S Unspecified intracranial injury without loss of consciousness, sequela: Secondary | ICD-10-CM

## 2020-11-19 DIAGNOSIS — M47816 Spondylosis without myelopathy or radiculopathy, lumbar region: Secondary | ICD-10-CM

## 2020-11-19 DIAGNOSIS — Z79899 Other long term (current) drug therapy: Secondary | ICD-10-CM

## 2020-11-19 DIAGNOSIS — Z5181 Encounter for therapeutic drug level monitoring: Secondary | ICD-10-CM

## 2020-11-19 DIAGNOSIS — G8929 Other chronic pain: Secondary | ICD-10-CM

## 2020-11-19 DIAGNOSIS — M545 Low back pain, unspecified: Secondary | ICD-10-CM

## 2020-11-19 DIAGNOSIS — F5101 Primary insomnia: Secondary | ICD-10-CM

## 2020-11-19 DIAGNOSIS — F0781 Postconcussional syndrome: Secondary | ICD-10-CM

## 2020-11-28 LAB — TOXASSURE SELECT,+ANTIDEPR,UR

## 2020-12-08 LAB — COMPREHENSIVE METABOLIC PANEL
ALT: 8 IU/L (ref 0–44)
AST: 17 IU/L (ref 0–40)
Albumin/Globulin Ratio: 2.3 — ABNORMAL HIGH (ref 1.2–2.2)
Albumin: 4.8 g/dL (ref 4.0–5.0)
Alkaline Phosphatase: 50 IU/L (ref 44–121)
BUN/Creatinine Ratio: 14 (ref 9–20)
BUN: 16 mg/dL (ref 6–20)
Bilirubin Total: 0.5 mg/dL (ref 0.0–1.2)
CO2: 24 mmol/L (ref 20–29)
Calcium: 9.4 mg/dL (ref 8.7–10.2)
Chloride: 103 mmol/L (ref 96–106)
Creatinine, Ser: 1.17 mg/dL (ref 0.76–1.27)
GFR calc Af Amer: 93 mL/min/{1.73_m2} (ref >59)
GFR calc non Af Amer: 80 mL/min/{1.73_m2} (ref >59)
Globulin, Total: 2.1 g/dL (ref 1.5–4.5)
Glucose: 91 mg/dL (ref 65–99)
Potassium: 4.2 mmol/L (ref 3.5–5.2)
Sodium: 142 mmol/L (ref 134–144)
Total Protein: 6.9 g/dL (ref 6.0–8.5)

## 2020-12-08 LAB — CBC
Hematocrit: 46.7 % (ref 37.5–51.0)
Hemoglobin: 15.8 g/dL (ref 13.0–17.7)
MCH: 31.5 pg (ref 26.6–33.0)
MCHC: 33.8 g/dL (ref 31.5–35.7)
MCV: 93 fL (ref 79–97)
Platelets: 240 10*3/uL (ref 150–450)
RBC: 5.02 x10E6/uL (ref 4.14–5.80)
RDW: 12.4 % (ref 11.6–15.4)
WBC: 4.3 10*3/uL (ref 3.4–10.8)

## 2020-12-08 LAB — VITAMIN B1: Thiamine: 173.7 nmol/L (ref 66.5–200.0)

## 2020-12-08 LAB — INSULIN-LIKE GROWTH FACTOR: Insulin-Like GF-1: 150 ng/mL (ref 95–290)

## 2020-12-08 LAB — VITAMIN D 1,25 DIHYDROXY
Vitamin D 1, 25 (OH)2 Total: 44 pg/mL
Vitamin D2 1, 25 (OH)2: <10 pg/mL
Vitamin D3 1, 25 (OH)2: 44 pg/mL

## 2020-12-08 LAB — T4, FREE: Free T4: 1.6 ng/dL (ref 0.82–1.77)

## 2020-12-08 LAB — VITAMIN B12: Vitamin B-12: 1449 pg/mL — ABNORMAL HIGH (ref 232–1245)

## 2020-12-08 LAB — T4: T4, Total: 7.1 ug/dL (ref 4.5–12.0)

## 2020-12-08 LAB — TSH: TSH: 1.28 u[IU]/mL (ref 0.450–4.500)

## 2020-12-08 LAB — VITAMIN B6: Vitamin B6: 30.1 ug/L (ref 5.3–46.7)

## 2020-12-08 LAB — CORTISOL-AM, BLOOD: Cortisol - AM: 12.8 ug/dL (ref 6.2–19.4)

## 2020-12-08 LAB — TESTOSTERONE: Testosterone: 736 ng/dL (ref 264–916)

## 2020-12-08 LAB — TESTOSTERONE, FREE: Testosterone, Free: 13.4 pg/mL (ref 8.7–25.1)

## 2020-12-09 ENCOUNTER — Telehealth: Payer: Self-pay | Admitting: *Deleted

## 2020-12-09 MED ORDER — AMPHETAMINE-DEXTROAMPHETAMINE 20 MG PO TABS
20.0000 mg | ORAL_TABLET | Freq: Two times a day (BID) | ORAL | 0 refills | Status: DC
Start: 2020-12-09 — End: 2021-01-21

## 2020-12-09 NOTE — Telephone Encounter (Signed)
That's fine. I sent in new order for adderall. All of [name]'s remaining labs have looked ok, by the way.   Take care!  ZTS

## 2020-12-09 NOTE — Telephone Encounter (Signed)
Urine drug screen for this encounter is consistent for prescribed medication 

## 2021-01-10 ENCOUNTER — Other Ambulatory Visit: Payer: Self-pay | Admitting: Physical Medicine & Rehabilitation

## 2021-01-10 DIAGNOSIS — F068 Other specified mental disorders due to known physiological condition: Secondary | ICD-10-CM

## 2021-01-10 DIAGNOSIS — F0781 Postconcussional syndrome: Secondary | ICD-10-CM

## 2021-01-10 DIAGNOSIS — F5101 Primary insomnia: Secondary | ICD-10-CM

## 2021-01-10 DIAGNOSIS — S069X0S Unspecified intracranial injury without loss of consciousness, sequela: Secondary | ICD-10-CM

## 2021-01-20 ENCOUNTER — Other Ambulatory Visit: Payer: Self-pay

## 2021-01-21 ENCOUNTER — Telehealth: Payer: Self-pay

## 2021-01-21 MED ORDER — AMPHETAMINE-DEXTROAMPHETAMINE 20 MG PO TABS: 20.0000 mg | ORAL_TABLET | Freq: Two times a day (BID) | ORAL | 0 refills | Status: DC

## 2021-01-21 NOTE — Telephone Encounter (Signed)
Frank Frye called for adderall refill . Last filled by PMP 12/09/20 Next appt is 02/12/21.

## 2021-01-21 NOTE — Telephone Encounter (Signed)
Notified. 

## 2021-01-21 NOTE — Telephone Encounter (Signed)
adderrall refilled. thx

## 2021-02-06 ENCOUNTER — Telehealth: Payer: Self-pay

## 2021-02-06 DIAGNOSIS — F5101 Primary insomnia: Secondary | ICD-10-CM

## 2021-02-06 DIAGNOSIS — F068 Other specified mental disorders due to known physiological condition: Secondary | ICD-10-CM

## 2021-02-06 DIAGNOSIS — F0781 Postconcussional syndrome: Secondary | ICD-10-CM

## 2021-02-06 NOTE — Telephone Encounter (Signed)
Faiz Weber is mother of [facility] who called the office wanting to discuss some  congnactivity issues going on with [facility] in private without patient being present and would like a call back or to be discussed in the appt on April 13th again without her son knowing her call back number is [phone]. Thank you.

## 2021-02-07 NOTE — Telephone Encounter (Signed)
Spoke with Mrs Pallone about her observations. Sleep might be the real problem here. Saw sleep specialist who recommended reducing belsomra which did'nt help. [name] gets up in the morning after inconsistent sleep feeling like a "zombie".  Pt resumed 10mg  dose 3 days ago. Advised that they add melatonin 3-5mg  along with this. We'll discuss more at his OV this week.

## 2021-02-12 ENCOUNTER — Encounter: Payer: Medicaid Other | Attending: Physical Medicine & Rehabilitation | Admitting: Physical Medicine & Rehabilitation

## 2021-02-12 ENCOUNTER — Encounter: Payer: Self-pay | Admitting: Physical Medicine & Rehabilitation

## 2021-02-12 ENCOUNTER — Other Ambulatory Visit: Payer: Self-pay

## 2021-02-12 VITALS — BP 112/73 | HR 74 | Temp 98.0°F | Ht 74.0 in | Wt 148.2 lb

## 2021-02-12 DIAGNOSIS — Z5181 Encounter for therapeutic drug level monitoring: Secondary | ICD-10-CM | POA: Diagnosis present

## 2021-02-12 DIAGNOSIS — F0781 Postconcussional syndrome: Secondary | ICD-10-CM | POA: Insufficient documentation

## 2021-02-12 DIAGNOSIS — M545 Low back pain, unspecified: Secondary | ICD-10-CM | POA: Diagnosis present

## 2021-02-12 DIAGNOSIS — F5101 Primary insomnia: Secondary | ICD-10-CM | POA: Diagnosis present

## 2021-02-12 DIAGNOSIS — F068 Other specified mental disorders due to known physiological condition: Secondary | ICD-10-CM | POA: Insufficient documentation

## 2021-02-12 DIAGNOSIS — G8929 Other chronic pain: Secondary | ICD-10-CM | POA: Diagnosis present

## 2021-02-12 DIAGNOSIS — Z79899 Other long term (current) drug therapy: Secondary | ICD-10-CM | POA: Insufficient documentation

## 2021-02-12 DIAGNOSIS — M47816 Spondylosis without myelopathy or radiculopathy, lumbar region: Secondary | ICD-10-CM | POA: Insufficient documentation

## 2021-02-12 DIAGNOSIS — S069X0S Unspecified intracranial injury without loss of consciousness, sequela: Secondary | ICD-10-CM | POA: Insufficient documentation

## 2021-02-12 MED ORDER — CLONAZEPAM 1 MG PO TABS: 1.0000 mg | ORAL_TABLET | Freq: Every day | ORAL | 2 refills | Status: DC

## 2021-02-12 MED ORDER — AMPHETAMINE-DEXTROAMPHETAMINE 20 MG PO TABS: 20.0000 mg | ORAL_TABLET | Freq: Two times a day (BID) | ORAL | 0 refills | Status: DC

## 2021-02-12 NOTE — Progress Notes (Signed)
Subjective:    Patient ID: Frank Frye, male    DOB: 1986-08-27, 35 y.o.   MRN: [id number]  HPI   Frank Frye is here in follow-up of his traumatic brain injury.  Mother has noted that Frank Frye has been experiencing some cognitive decline with more confusion and fatigue.  At the same time he has had ongoing difficulties with sleeping.  He was advised to come off the Belsomra but this apparently only made things worse.  He just resumed the 10mg  dose of the drug about a week ago with some improvement per mother. He also started on melatonin 5mg  daily. He uses klonopin also at night.   Pain Inventory Average Pain 1 Pain Right Now 0 My pain is .  In the last 24 hours, has pain interfered with the following? General activity 1 Relation with others 0 Enjoyment of life 1 What TIME of day is your pain at its worst? evening Sleep (in general) Poor  Pain is worse with: sitting Pain improves with: rest and therapy/exercise Relief from Meds: 2  No family history on file. Social History   Socioeconomic History  . Marital status: Single    Spouse name: Not on file  . Number of children: Not on file  . Years of education: Not on file  . Highest education level: Not on file  Occupational History  . Not on file  Tobacco Use  . Smoking status: Never Smoker  . Smokeless tobacco: Never Used  Vaping Use  . Vaping Use: Never used  Substance and Sexual Activity  . Alcohol use: Yes    Comment: occ  . Drug use: Never  . Sexual activity: Not on file  Other Topics Concern  . Not on file  Social History Narrative  . Not on file   Social Determinants of Health   Financial Resource Strain: Not on file  Food Insecurity: Not on file  Transportation Needs: Not on file  Physical Activity: Not on file  Stress: Not on file  Social Connections: Not on file   Past Surgical History:  Procedure Laterality Date  . FEMUR FRACTURE SURGERY    . LESION EXCISION WITH COMPLEX REPAIR Right 04/29/2020    Procedure: Excision of 3 scalp cysts with complex closure;  Surgeon: , MD;  Location: [facility];  Service: Plastics;  Laterality: Right;  45 min, please  . NOSE SURGERY    . WISDOM TOOTH EXTRACTION     Past Surgical History:  Procedure Laterality Date  . FEMUR FRACTURE SURGERY    . LESION EXCISION WITH COMPLEX REPAIR Right 04/29/2020   Procedure: Excision of 3 scalp cysts with complex closure;  Surgeon: Allena Napoleon, MD;  Location: [facility];  Service: Plastics;  Laterality: Right;  45 min, please  . NOSE SURGERY    . WISDOM TOOTH EXTRACTION     Past Medical History:  Diagnosis Date  . Attention and concentration deficit   . Insomnia   . Sleep apnea    mild no cpap  . Traumatic brain injury (HCC)    2009   BP 112/73   Pulse 74   Temp 98 F (36.7 C)   Ht 6\' 2"  (1.88 m)   Wt 148 lb 3.2 oz (67.2 kg)   SpO2 99%   BMI 19.03 kg/m   Opioid Risk Score:   Fall Risk Score:  `1  Depression screen PHQ 2/9  Depression screen [facility] 2/9 07/17/2020 05/15/2020 05/17/2019 05/09/2018  Decreased Interest 0 0 0 0  Down, Depressed, Hopeless 0 0 0 0  PHQ - 2 Score 0 0 0 0   Review of Systems     Objective:   Physical Exam General: No acute distress HEENT: EOMI, oral membranes moist Cards: reg rate  Chest: normal effort Abdomen: Soft, NT, ND Skin: dry, intact Extremities: no edema Psych: pleasant and appropriate Neuro:   processing and STM deficits. Slow to initiate.  Musculoskeletal: normal ROM  Psych: flat, delayed. Struggles to concentrate. Fair STM     Assessment & Plan:   1. 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 Insomnia related to above.  Greatly improved 3. 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  Symptoms have resolved    Plan:   1. Klonopin for now at night. RF today 2. Continue trazodone at 200mg  3. Belsomra 10mg  qhs has been the most effective agent  for sleep for [name].              -sleep hygiene reviewed again            -day time activity was discussed            -increase melatonin to 10mg  for 7-10 days.  -if no improvement can increase belsomra to 15mg   -consider reducing klonopin also.                           4. Continue adderall                              5. Consider trial of seroquel                         6. Recent lab testing normal   15 minutes of time was spent with the patient in discussion and review of his treatment regimen.  I will see him back in about 3 months time

## 2021-02-12 NOTE — Patient Instructions (Signed)
1. INCREASE MELATONIN 10MG  NIGHTLY FOR THE NEXT 7-10 DAYS. 2. CONSIDER BELSOMRA INCREASE IF THAT'S NOT EFFECTIVE  3. INCREASE YOUR DAILY ACTIVITY FROM BOTH A PHYSICAL AND MENTAL STANDPOINT TO HELP YOUR "DAY/NIGHT" CLOCK.

## 2021-02-27 ENCOUNTER — Telehealth: Payer: Self-pay

## 2021-02-27 MED ORDER — BELSOMRA 15 MG PO TABS: 15.0000 mg | ORAL_TABLET | Freq: Every day | ORAL | 3 refills | Status: DC

## 2021-02-27 NOTE — Telephone Encounter (Signed)
rx for 15mg  sent in. thx

## 2021-02-27 NOTE — Telephone Encounter (Signed)
As discussed in Rainn's last office visit, Mrs. Dayhoff would like to increase Verle's Belsomra to 15 MGs. Please send the new script to [facility], in [place].  Thank you

## 2021-02-28 NOTE — Telephone Encounter (Signed)
Message left on voicemail,

## 2021-04-17 ENCOUNTER — Telehealth: Payer: Self-pay | Admitting: *Deleted

## 2021-04-17 ENCOUNTER — Other Ambulatory Visit: Payer: Self-pay | Admitting: Physical Medicine & Rehabilitation

## 2021-04-17 DIAGNOSIS — S069X0S Unspecified intracranial injury without loss of consciousness, sequela: Secondary | ICD-10-CM

## 2021-04-17 DIAGNOSIS — F0781 Postconcussional syndrome: Secondary | ICD-10-CM

## 2021-04-17 NOTE — Telephone Encounter (Signed)
Frank Frye called for a refill on his Adderall.

## 2021-04-18 MED ORDER — AMPHETAMINE-DEXTROAMPHETAMINE 20 MG PO TABS: 20.0000 mg | ORAL_TABLET | Freq: Two times a day (BID) | ORAL | 0 refills | Status: DC

## 2021-04-18 MED ORDER — AMPHETAMINE-DEXTROAMPHETAMINE 20 MG PO TABS: 20.0000 mg | ORAL_TABLET | Freq: Every day | ORAL | 0 refills | Status: DC

## 2021-04-18 NOTE — Telephone Encounter (Signed)
June July rx'es sent in

## 2021-05-14 ENCOUNTER — Other Ambulatory Visit: Payer: Self-pay

## 2021-05-14 ENCOUNTER — Encounter: Payer: Self-pay | Admitting: Physical Medicine & Rehabilitation

## 2021-05-14 ENCOUNTER — Encounter: Payer: Medicaid Other | Attending: Physical Medicine & Rehabilitation | Admitting: Physical Medicine & Rehabilitation

## 2021-05-14 VITALS — BP 115/73 | HR 81 | Temp 99.2°F | Ht 74.0 in | Wt 150.6 lb

## 2021-05-14 DIAGNOSIS — Z5181 Encounter for therapeutic drug level monitoring: Secondary | ICD-10-CM

## 2021-05-14 DIAGNOSIS — G8929 Other chronic pain: Secondary | ICD-10-CM

## 2021-05-14 DIAGNOSIS — S069X0S Unspecified intracranial injury without loss of consciousness, sequela: Secondary | ICD-10-CM | POA: Diagnosis present

## 2021-05-14 DIAGNOSIS — M47816 Spondylosis without myelopathy or radiculopathy, lumbar region: Secondary | ICD-10-CM | POA: Diagnosis present

## 2021-05-14 DIAGNOSIS — Z79899 Other long term (current) drug therapy: Secondary | ICD-10-CM | POA: Diagnosis present

## 2021-05-14 DIAGNOSIS — R4189 Other symptoms and signs involving cognitive functions and awareness: Secondary | ICD-10-CM

## 2021-05-14 DIAGNOSIS — F068 Other specified mental disorders due to known physiological condition: Secondary | ICD-10-CM | POA: Diagnosis present

## 2021-05-14 DIAGNOSIS — F0781 Postconcussional syndrome: Secondary | ICD-10-CM

## 2021-05-14 DIAGNOSIS — F5101 Primary insomnia: Secondary | ICD-10-CM | POA: Diagnosis present

## 2021-05-14 DIAGNOSIS — M545 Low back pain, unspecified: Secondary | ICD-10-CM | POA: Insufficient documentation

## 2021-05-14 MED ORDER — DICLOFENAC SODIUM 50 MG PO TBEC: 50.0000 mg | DELAYED_RELEASE_TABLET | Freq: Two times a day (BID) | ORAL | 3 refills | Status: DC

## 2021-05-14 MED ORDER — AMPHETAMINE-DEXTROAMPHETAMINE 20 MG PO TABS: 20.0000 mg | ORAL_TABLET | Freq: Every day | ORAL | 0 refills | Status: DC

## 2021-05-14 MED ORDER — QUETIAPINE FUMARATE 25 MG PO TABS: 25.0000 mg | ORAL_TABLET | Freq: Every day | ORAL | 5 refills | Status: DC

## 2021-05-14 MED ORDER — CLONAZEPAM 1 MG PO TABS: 1.0000 mg | ORAL_TABLET | Freq: Every day | ORAL | 2 refills | Status: DC

## 2021-05-14 NOTE — Patient Instructions (Addendum)
AT BEDTIME:  WEEK 1: BREAK BELSOMRA IN HALF TO 7.5MG       BEGIN SEROQUEL 25MG    WEEK 2: STOP BELSOMRA      CONTINUE WITH SEROQUEL 25MG , IF SLEEP WORSENS OVER THE NEXT 2 DAYS, THEN INCREASE SEROQUEL TO 50MG .      USE DICLOFENAC SCHEDULED FOR 2 WEEKS OR UNTIL YOUR BACK PAIN SUBSIDES  BE ACTIVE WITH EXERCISE AND STRETCHES.

## 2021-05-14 NOTE — Progress Notes (Signed)
Subjective:    Patient ID: Frank Frye, male    DOB: Apr 07, 1986, 35 y.o.   MRN: [id number]  HPI  Frank Frye is here in follow up of his TBI and associated deficits. He and his mom both came down with COVID last month but did fairly well.  Although, his back flared up when he came down with COVID. He has resumed some of the old diclofenac and Robaxin but has not seen much of a difference and really has not taken them consistently.  He is doing some of his home stretches.  Pain is mostly axial just above his waistline.  From a sleep standpoint increasing Belsomra did not make a big difference.  He still struggles to fall and stay asleep.  He is taking 15 milligrams currently along with trazodone and melatonin.  His weight remains fairly constant although he eats a lot and does not gain.  He remains on Adderall as prescribed for his attention and concentration.   Pain Inventory Average Pain 3 Pain Right Now 2 My pain is intermittent and sharp  LOCATION OF PAIN  Lower back pain  BOWEL Number of stools per week: 7  Oral laxative use No  Type of laxative n/a  Enema or suppository use No  History of colostomy No  Incontinent No   BLADDER Normal In and out cath, frequency n/a Able to self cath No  Bladder incontinence No  Frequent urination Yes  Leakage with coughing No  Difficulty starting stream No  Incomplete bladder emptying No    Mobility walk without assistance ability to climb steps?  yes do you drive?  yes use a wheelchair Do you have any goals in this area?  yes  Function disabled: date disabled 2009 Do you have any goals in this area?  yes  Neuro/Psych weakness spasms  Prior Studies Any changes since last visit?  no  Physicians involved in your care Any changes since last visit?  no   History reviewed. No pertinent family history. Social History   Socioeconomic History   Marital status: Single    Spouse name: Not on file   Number of  children: Not on file   Years of education: Not on file   Highest education level: Not on file  Occupational History   Not on file  Tobacco Use   Smoking status: Never   Smokeless tobacco: Never  Vaping Use   Vaping Use: Never used  Substance and Sexual Activity   Alcohol use: Yes    Comment: occ   Drug use: Never   Sexual activity: Not on file  Other Topics Concern   Not on file  Social History Narrative   Not on file   Social Determinants of Health   Financial Resource Strain: Not on file  Food Insecurity: Not on file  Transportation Needs: Not on file  Physical Activity: Not on file  Stress: Not on file  Social Connections: Not on file   Past Surgical History:  Procedure Laterality Date   FEMUR FRACTURE SURGERY     LESION EXCISION WITH COMPLEX REPAIR Right 04/29/2020   Procedure: Excision of 3 scalp cysts with complex closure;  Surgeon: Allena Napoleon, MD;  Location: [facility];  Service: Plastics;  Laterality: Right;  45 min, please   NOSE SURGERY     WISDOM TOOTH EXTRACTION     Past Medical History:  Diagnosis Date   Attention and concentration deficit    Insomnia    Sleep  apnea    mild no cpap   Traumatic brain injury (HCC)    2009   BP 115/73   Pulse 81   Temp 99.2 F (37.3 C)   Ht 6\' 2"  (1.88 m)   Wt 150 lb 9.6 oz (68.3 kg)   SpO2 96%   BMI 19.34 kg/m   Opioid Risk Score:   Fall Risk Score:  `1  Depression screen PHQ 2/9  Depression screen [facility] 2/9 07/17/2020 05/15/2020 05/17/2019 05/09/2018  Decreased Interest 0 0 0 0  Down, Depressed, Hopeless 0 0 0 0  PHQ - 2 Score 0 0 0 0    Review of Systems  Eyes:  Negative for visual disturbance.  Genitourinary:  Positive for frequency.  Musculoskeletal:  Positive for back pain.  All other systems reviewed and are negative.     Objective:   Physical Exam  General: No acute distress HEENT: EOMI, oral membranes moist Cards: reg rate Chest: normal effort Abdomen: Soft, NT, ND Skin:  dry, intact Extremities: no edema Psych: flat but cooperative Neuro:   processing delays. Slow to initiate  Musculoskeletal: normal ROM       Assessment & Plan:   1. 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 Insomnia related to above.  3. 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  exacerbated nby recent covid illness   Plan:   1. Klonopin for now at night. RF today 2. Continue trazodone at 200mg  3. Wean belsomra to off. Trial of seroquel 25mg  to start, increasing to 50mg  if needed. Melatonin ok if needed                           4. Continue adderall. RF today                            5. resume diclofenac for back pain. 50mg  bid until pain subsides. Resume HEP.                          6. Recent lab testing normal   Fifteen minutes of face to face patient care time were spent during this visit. All questions were encouraged and answered.  Follow up with me in 3 mos

## 2021-05-15 ENCOUNTER — Telehealth: Payer: Self-pay

## 2021-05-15 NOTE — Telephone Encounter (Signed)
Approval for Quetiapine Fumarate 25 mg 05/15/2021-05/15/2022

## 2021-05-15 NOTE — Telephone Encounter (Signed)
PA Approved for QUEtiapine Case: [id number], Status: Approved, Coverage Starts on: 05/15/2021 12:00:00 AM, Coverage Ends on: 05/15/2022 12:00:00 AM.

## 2021-05-16 ENCOUNTER — Telehealth: Payer: Self-pay

## 2021-05-16 MED ORDER — AMPHETAMINE-DEXTROAMPHETAMINE 20 MG PO TABS: 20.0000 mg | ORAL_TABLET | Freq: Two times a day (BID) | ORAL | 0 refills | Status: DC

## 2021-05-16 NOTE — Addendum Note (Signed)
Addended by: Faith Rogue T on: 05/16/2021 11:58 AM   Modules accepted: Orders

## 2021-05-16 NOTE — Telephone Encounter (Signed)
Mr. Frank Frye needs a new RX stating the Adderall is to be taken "twice daily.".[place] is currently out of the medication. [place]

## 2021-05-19 ENCOUNTER — Telehealth: Payer: Self-pay | Admitting: *Deleted

## 2021-05-19 NOTE — Telephone Encounter (Signed)
Prior auth for Diclofenac Sodium EC tablets #60 for 05/16/21- 05/16/22.

## 2021-07-04 ENCOUNTER — Other Ambulatory Visit: Payer: Self-pay

## 2021-07-04 MED ORDER — AMPHETAMINE-DEXTROAMPHETAMINE 20 MG PO TABS: 20.0000 mg | ORAL_TABLET | Freq: Two times a day (BID) | ORAL | 0 refills | Status: DC

## 2021-07-04 NOTE — Telephone Encounter (Signed)
Please send new Rx [facility] to [facility] in [place]. The [facility] is out.  The current Rx has been cancelled at [facility].

## 2021-07-04 NOTE — Telephone Encounter (Signed)
PMP was Reviewed.  Dr Riley Kill note was reviewed.  Adderall e- scribed today. l

## 2021-08-07 ENCOUNTER — Telehealth: Payer: Self-pay

## 2021-08-07 NOTE — Telephone Encounter (Signed)
[employer] has requested a new PA for Belsomra 15 MG. Per 05/14/2021 office note the plan is to wean off Belsomra.   I called his mother and she confirmed the plan. Patient does not need Rx filled. No PA created.   Please advise if needed.  Call back phone [phone]

## 2021-08-18 ENCOUNTER — Other Ambulatory Visit: Payer: Self-pay | Admitting: Physical Medicine & Rehabilitation

## 2021-08-18 DIAGNOSIS — F0781 Postconcussional syndrome: Secondary | ICD-10-CM

## 2021-08-18 DIAGNOSIS — M545 Low back pain, unspecified: Secondary | ICD-10-CM

## 2021-08-18 DIAGNOSIS — M47816 Spondylosis without myelopathy or radiculopathy, lumbar region: Secondary | ICD-10-CM

## 2021-08-18 DIAGNOSIS — Z79899 Other long term (current) drug therapy: Secondary | ICD-10-CM

## 2021-08-18 DIAGNOSIS — F5101 Primary insomnia: Secondary | ICD-10-CM

## 2021-08-18 DIAGNOSIS — S069X0S Unspecified intracranial injury without loss of consciousness, sequela: Secondary | ICD-10-CM

## 2021-08-18 DIAGNOSIS — G8929 Other chronic pain: Secondary | ICD-10-CM

## 2021-08-18 DIAGNOSIS — Z5181 Encounter for therapeutic drug level monitoring: Secondary | ICD-10-CM

## 2021-08-20 ENCOUNTER — Other Ambulatory Visit: Payer: Self-pay

## 2021-08-20 ENCOUNTER — Encounter: Payer: Self-pay | Admitting: Physical Medicine & Rehabilitation

## 2021-08-20 ENCOUNTER — Encounter: Payer: Medicaid Other | Attending: Physical Medicine & Rehabilitation | Admitting: Physical Medicine & Rehabilitation

## 2021-08-20 VITALS — BP 113/74 | HR 84 | Temp 98.3°F | Ht 74.0 in | Wt 148.0 lb

## 2021-08-20 DIAGNOSIS — F068 Other specified mental disorders due to known physiological condition: Secondary | ICD-10-CM | POA: Insufficient documentation

## 2021-08-20 DIAGNOSIS — F5101 Primary insomnia: Secondary | ICD-10-CM | POA: Insufficient documentation

## 2021-08-20 DIAGNOSIS — G8929 Other chronic pain: Secondary | ICD-10-CM | POA: Insufficient documentation

## 2021-08-20 DIAGNOSIS — Z79899 Other long term (current) drug therapy: Secondary | ICD-10-CM | POA: Diagnosis present

## 2021-08-20 DIAGNOSIS — M545 Low back pain, unspecified: Secondary | ICD-10-CM | POA: Insufficient documentation

## 2021-08-20 DIAGNOSIS — F0781 Postconcussional syndrome: Secondary | ICD-10-CM | POA: Diagnosis present

## 2021-08-20 DIAGNOSIS — G4709 Other insomnia: Secondary | ICD-10-CM | POA: Insufficient documentation

## 2021-08-20 DIAGNOSIS — S069X0S Unspecified intracranial injury without loss of consciousness, sequela: Secondary | ICD-10-CM | POA: Insufficient documentation

## 2021-08-20 DIAGNOSIS — Z5181 Encounter for therapeutic drug level monitoring: Secondary | ICD-10-CM | POA: Insufficient documentation

## 2021-08-20 DIAGNOSIS — M47816 Spondylosis without myelopathy or radiculopathy, lumbar region: Secondary | ICD-10-CM | POA: Diagnosis present

## 2021-08-20 MED ORDER — CLONAZEPAM 1 MG PO TABS: ORAL_TABLET | ORAL | 3 refills | Status: DC

## 2021-08-20 MED ORDER — QUETIAPINE FUMARATE 50 MG PO TABS: 50.0000 mg | ORAL_TABLET | Freq: Every day | ORAL | 5 refills | Status: AC

## 2021-08-20 MED ORDER — AMPHETAMINE-DEXTROAMPHETAMINE 20 MG PO TABS: 20.0000 mg | ORAL_TABLET | Freq: Two times a day (BID) | ORAL | 0 refills | Status: DC

## 2021-08-20 NOTE — Progress Notes (Signed)
Subjective:    Patient ID: Frank Frye, male    DOB: August 15, 1986, 35 y.o.   MRN: [id number]  HPI  Frank Frye is here in follow up of his TBI and associated cognitive deficits. We transitioned off the belsomra to seroquel. The 50mg  dose seems to have helped although he feels slow in the morning for an hour or two from it.  With this addition we also decreased his trazodone to 200 mg at bedtime.  He remains on Klonopin 1 mg to help him fall asleep.  Additionally, he continues to have concerns of the weight loss.  He is eating frequently but does not seem to be putting on weight.  He has tried an Ensure shake but is not a big fan of this.  He has not tried taking any protein supplements.  He and his mother continue to work on their transition down to .  They should be moving down there over the next few months.  He has not found a doctor down in the area just yet.  Pain Inventory Average Pain 1 Pain Right Now 0 My pain is intermittent  LOCATION OF PAIN  back  BOWEL Number of stools per week: 7 Oral laxative use No  Type of laxative na Enema or suppository use No  History of colostomy No  Incontinent No   BLADDER Normal In and out cath, frequency na Able to self cath  na Bladder incontinence No  Frequent urination No  Leakage with coughing No  Difficulty starting stream No  Incomplete bladder emptying No    Mobility walk without assistance ability to climb steps?  yes do you drive?  yes use a wheelchair  Function disabled: date disabled 2009  Neuro/Psych weakness spasms  Prior Studies Any changes since last visit?  no  Physicians involved in your care Any changes since last visit?  no   History reviewed. No pertinent family history. Social History   Socioeconomic History   Marital status: Single    Spouse name: Not on file   Number of children: Not on file   Years of education: Not on file   Highest education level: Not on file  Occupational  History   Not on file  Tobacco Use   Smoking status: Never   Smokeless tobacco: Never  Vaping Use   Vaping Use: Never used  Substance and Sexual Activity   Alcohol use: Yes    Comment: occ   Drug use: Never   Sexual activity: Not on file  Other Topics Concern   Not on file  Social History Narrative   Not on file   Social Determinants of Health   Financial Resource Strain: Not on file  Food Insecurity: Not on file  Transportation Needs: Not on file  Physical Activity: Not on file  Stress: Not on file  Social Connections: Not on file   Past Surgical History:  Procedure Laterality Date   FEMUR FRACTURE SURGERY     LESION EXCISION WITH COMPLEX REPAIR Right 04/29/2020   Procedure: Excision of 3 scalp cysts with complex closure;  Surgeon: 05/01/2020, MD;  Location: [facility];  Service: Plastics;  Laterality: Right;  45 min, please   NOSE SURGERY     WISDOM TOOTH EXTRACTION     Past Medical History:  Diagnosis Date   Attention and concentration deficit    Insomnia    Sleep apnea    mild no cpap   Traumatic brain injury  2009   BP 113/74   Pulse 84   Temp 98.3 F (36.8 C) (Oral)   Ht 6\' 2"  (1.88 m)   Wt 148 lb (67.1 kg)   SpO2 96%   BMI 19.00 kg/m   Opioid Risk Score:   Fall Risk Score:  `1  Depression screen PHQ 2/9  Depression screen [facility] 2/9 05/14/2021 07/17/2020 05/15/2020 05/17/2019 05/09/2018  Decreased Interest 0 0 0 0 0  Down, Depressed, Hopeless 0 0 0 0 0  PHQ - 2 Score 0 0 0 0 0     Review of Systems  Musculoskeletal:  Positive for back pain.  All other systems reviewed and are negative.     Objective:   Physical Exam  General: No acute distress HEENT: NCAT, EOMI, oral membranes moist Cards: reg rate  Chest: normal effort Abdomen: Soft, NT, ND Skin: dry, intact Extremities: no edema Psych: flat but pleasant Neuro:   processing delays. Slow to initiate  Musculoskeletal: normal ROM        Assessment & Plan:   1. Late  effects of TBI (2009) with attention and processing deficits, persistent/increased  fatigue 2. Insomnia related to above. seroquel seems to have helped 3. 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  exacerbated nby recent covid illness   Plan:   1. Klonopin for now at night. RF today 2. Continue trazodone at 200mg  3. Continue seroquel 50mg  at night with taper of trazodone to 100mg  and then off. Consider weaning klonopin oo                             4. Continue adderall. RF today                            5.  diclofenac for back pain. 50mg  bid prn    -hep                         6.  Discussed adding protein supplementation to his diet, at least 30 g additionally per day to start.   15 minutes of face to face patient care time were spent during this visit. All questions were encouraged and answered.  Follow up with me in 6 mos .  I will help them remotely with medications and such until they find some follow-up locally.

## 2021-08-20 NOTE — Patient Instructions (Addendum)
PLEASE FEEL FREE TO CALL OUR OFFICE WITH ANY PROBLEMS OR QUESTIONS [phone]  REDUCE TRAZODONE TO 100MG  AND TAKE WITH THE 50MG  OF SEROQUEL AND SEE HOW THAT DOES FOR YOU FOR 2-3 DAYS. IF YOU ARE SLEEPING AND STILL FEEL SLOWED THE NEXT MORNING, THEN STOP TRAZODONE ENTIRELY. IF YOU'RE STILL HAVING SIDE EFFECTS IN THE MORNING, THEN CALL ME.

## 2021-09-24 ENCOUNTER — Encounter: Payer: Medicaid Other | Attending: Physical Medicine & Rehabilitation | Admitting: Physical Medicine & Rehabilitation

## 2021-09-24 ENCOUNTER — Other Ambulatory Visit: Payer: Self-pay

## 2021-09-24 ENCOUNTER — Encounter: Payer: Self-pay | Admitting: Physical Medicine & Rehabilitation

## 2021-09-24 ENCOUNTER — Ambulatory Visit
Admission: RE | Admit: 2021-09-24 | Discharge: 2021-09-24 | Disposition: A | Discharge status: Home / Self Care | Payer: Medicaid Other | Source: Ambulatory Visit | Attending: Physical Medicine & Rehabilitation | Admitting: Physical Medicine & Rehabilitation

## 2021-09-24 VITALS — BP 118/75 | HR 73 | Temp 98.0°F | Ht 74.0 in | Wt 151.0 lb

## 2021-09-24 DIAGNOSIS — M545 Low back pain, unspecified: Secondary | ICD-10-CM | POA: Diagnosis not present

## 2021-09-24 MED ORDER — METHYLPREDNISOLONE 4 MG PO TBPK: ORAL_TABLET | ORAL | 1 refills | Status: AC

## 2021-09-24 MED ORDER — METHOCARBAMOL 500 MG PO TABS: 500.0000 mg | ORAL_TABLET | Freq: Three times a day (TID) | ORAL | 1 refills | Status: AC | PRN

## 2021-09-24 MED ORDER — DICLOFENAC SODIUM 75 MG PO TBEC: 75.0000 mg | DELAYED_RELEASE_TABLET | Freq: Two times a day (BID) | ORAL | 3 refills | Status: AC

## 2021-09-24 NOTE — Progress Notes (Signed)
Subjective:    Patient ID: Frank Frye, male    DOB: [birth year], 35 y.o.   MRN: [id number]  HPI  Frank Frye is here in follow up today with complaints of low back pain since last Friday. He was bending over trying to break up a bag of fertilizer that was clumped together. At the time he felt a "pop" in his low back. Since then he has had excrutiating pain. He has been using some left over diclofenac and flexeril as well as heat. No radiation to the legs but it hurts to put on his shoes, bending. It hurts the most to move from a lying to sitting position   Pain Inventory Average Pain 6 Pain Right Now 8 My pain is constant, sharp, and aching  In the last 24 hours, has pain interfered with the following? General activity 10 Relation with others 9 Enjoyment of life 10 What TIME of day is your pain at its worst? morning , daytime, evening, and night Sleep (in general) Poor  Pain is worse with: bending and some activites Pain improves with: rest, medication, and heat Relief from Meds:  a little  No family history on file. Social History   Socioeconomic History   Marital status: Single    Spouse name: Not on file   Number of children: Not on file   Years of education: Not on file   Highest education level: Not on file  Occupational History   Not on file  Tobacco Use   Smoking status: Never   Smokeless tobacco: Never  Vaping Use   Vaping Use: Never used  Substance and Sexual Activity   Alcohol use: Yes    Comment: occ   Drug use: Never   Sexual activity: Not on file  Other Topics Concern   Not on file  Social History Narrative   Not on file   Social Determinants of Health   Financial Resource Strain: Not on file  Food Insecurity: Not on file  Transportation Needs: Not on file  Physical Activity: Not on file  Stress: Not on file  Social Connections: Not on file   Past Surgical History:  Procedure Laterality Date   FEMUR FRACTURE SURGERY     LESION EXCISION  WITH COMPLEX REPAIR Right 04/29/2020   Procedure: Excision of 3 scalp cysts with complex closure;  Surgeon: Allena Napoleon, MD;  Location: [facility];  Service: Plastics;  Laterality: Right;  45 min, please   NOSE SURGERY     WISDOM TOOTH EXTRACTION     Past Surgical History:  Procedure Laterality Date   FEMUR FRACTURE SURGERY     LESION EXCISION WITH COMPLEX REPAIR Right 04/29/2020   Procedure: Excision of 3 scalp cysts with complex closure;  Surgeon: Allena Napoleon, MD;  Location: [facility];  Service: Plastics;  Laterality: Right;  45 min, please   NOSE SURGERY     WISDOM TOOTH EXTRACTION     Past Medical History:  Diagnosis Date   Attention and concentration deficit    Insomnia    Sleep apnea    mild no cpap   Traumatic brain injury    2009   There were no vitals taken for this visit.  Opioid Risk Score:   Fall Risk Score:  `1  Depression screen PHQ 2/9  Depression screen [facility] 2/9 05/14/2021 07/17/2020 05/15/2020 05/17/2019 05/09/2018  Decreased Interest 0 0 0 0 0  Down, Depressed, Hopeless 0 0 0 0 0  PHQ - 2 Score 0 0 0 0 0    Review of Systems  Musculoskeletal:  Positive for back pain.  All other systems reviewed and are negative.     Objective:   Physical Exam  General: No acute distress HEENT: NCAT, EOMI, oral membranes moist Cards: reg rate  Chest: normal effort Abdomen: Soft, NT, ND Skin: dry, intact Extremities: no edema Psych: pleasant and appropriate  Neuro:   processing delays. Slow to initiate. Motor 5/5. No sensory findings in either leg Musculoskeletal: LOW Back ttp esp on left with taut paraspinals. Pain with SLR in back only on both sides. Pain with lumbar flexion only.         Assessment & Plan:   1. 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 Insomnia related to above. seroquel seems to have helped 3. 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  acute  exacerbation last week   Plan:   1. Klonopin for now at night. RF today 2. Continue trazodone at 200mg  3. Continue seroquel 50mg  at night with taper of trazodone to 100mg  and then off. Consider weaning klonopin oo                             4. Continue adderall. RF today                            5.  refill dicolfenac but at 75mg  dose                         6.  medrol dose pack ` 7. Robaxin 500mg  q8 prn 8. Xrays lumbar spine. 9. Continue stretching, heat, tylenol     of face to face patient care time were spent during this visit. All questions were encouraged and answered.  Follow up with me in 6 weeks.

## 2021-09-24 NOTE — Patient Instructions (Signed)
KEEP STRETCHING, MOVING, KEEP HEAT ON YOUR BACK TOO.

## 2021-10-09 ENCOUNTER — Telehealth: Payer: Self-pay

## 2021-10-09 DIAGNOSIS — F068 Other specified mental disorders due to known physiological condition: Secondary | ICD-10-CM

## 2021-10-09 DIAGNOSIS — S069X0S Unspecified intracranial injury without loss of consciousness, sequela: Secondary | ICD-10-CM

## 2021-10-09 MED ORDER — AMPHETAMINE-DEXTROAMPHETAMINE 20 MG PO TABS: 20.0000 mg | ORAL_TABLET | Freq: Two times a day (BID) | ORAL | 0 refills | Status: DC

## 2021-10-09 NOTE — Telephone Encounter (Signed)
Pt Frank Frye [facility] is calling to request a refill on Adderall medication he only has about 5 pills left .

## 2021-10-09 NOTE — Telephone Encounter (Signed)
Rx refilled.

## 2021-10-10 ENCOUNTER — Encounter: Payer: Self-pay | Admitting: Physical Medicine & Rehabilitation

## 2021-10-10 DIAGNOSIS — M545 Low back pain, unspecified: Secondary | ICD-10-CM

## 2021-10-10 NOTE — Telephone Encounter (Signed)
I have ordered MRI lumbar without contrast

## 2021-10-13 NOTE — Addendum Note (Signed)
Addended by: Doreene Eland on: 10/13/2021 03:39 PM   Modules accepted: Orders

## 2021-10-16 ENCOUNTER — Telehealth: Payer: Self-pay | Admitting: Physical Medicine & Rehabilitation

## 2021-10-16 NOTE — Telephone Encounter (Signed)
Bradfords mom called states the Auth for MRI needs to be sent in as expedited so it can be reviewed within 72 hours or the regular is 14 days to review

## 2021-10-29 ENCOUNTER — Other Ambulatory Visit: Payer: Medicaid Other

## 2021-11-12 ENCOUNTER — Other Ambulatory Visit: Payer: Self-pay

## 2021-11-12 ENCOUNTER — Encounter: Payer: Medicaid Other | Attending: Physical Medicine & Rehabilitation | Admitting: Physical Medicine & Rehabilitation

## 2021-11-12 ENCOUNTER — Encounter: Payer: Self-pay | Admitting: Physical Medicine & Rehabilitation

## 2021-11-12 VITALS — BP 113/73 | HR 78 | Temp 97.5°F | Ht 74.0 in | Wt 156.0 lb

## 2021-11-12 DIAGNOSIS — S069X0S Unspecified intracranial injury without loss of consciousness, sequela: Secondary | ICD-10-CM | POA: Insufficient documentation

## 2021-11-12 DIAGNOSIS — F068 Other specified mental disorders due to known physiological condition: Secondary | ICD-10-CM | POA: Insufficient documentation

## 2021-11-12 DIAGNOSIS — M545 Low back pain, unspecified: Secondary | ICD-10-CM | POA: Diagnosis present

## 2021-11-12 MED ORDER — PREDNISONE 20 MG PO TABS: 20.0000 mg | ORAL_TABLET | ORAL | 0 refills | Status: AC

## 2021-11-12 NOTE — Progress Notes (Signed)
Subjective:    Patient ID: Frank Frye, male    DOB: Apr 22, 1986, 36 y.o.   MRN: [id number]  HPI  [name] is here in follow up of his TBI and ongoing low back pain. His back continues to bother him with any activity. The pain continues to be midline with radiation into buttocks. He feels better with rest. The medrol dose pack seemed to have helped but wore off. He uses diclofenac twice daily and robaxin prn.  We ordered an MRI in December but this was denied by insurance as they said he needed further treatment before pursuing the study.  [name] continues to deal with ongoing issues related to his traumatic brain injury.  Sleep still can be a challenge for him at times.  Recent back pain is not helped.  Pain Inventory Average Pain 8 Pain Right Now 0 My pain is intermittent and sharp  In the last 24 hours, has pain interfered with the following? General activity 1 Relation with others 1 Enjoyment of life 1 What TIME of day is your pain at its worst? varies Sleep (in general) Poor  Pain is worse with: walking, bending, and some activites Pain improves with: rest, heat/ice, and medication Relief from Meds: 5  No family history on file. Social History   Socioeconomic History   Marital status: Single    Spouse name: Not on file   Number of children: Not on file   Years of education: Not on file   Highest education level: Not on file  Occupational History   Not on file  Tobacco Use   Smoking status: Never   Smokeless tobacco: Never  Vaping Use   Vaping Use: Never used  Substance and Sexual Activity   Alcohol use: Yes    Comment: occ   Drug use: Never   Sexual activity: Not on file  Other Topics Concern   Not on file  Social History Narrative   Not on file   Social Determinants of Health   Financial Resource Strain: Not on file  Food Insecurity: Not on file  Transportation Needs: Not on file  Physical Activity: Not on file  Stress: Not on file  Social  Connections: Not on file   Past Surgical History:  Procedure Laterality Date   FEMUR FRACTURE SURGERY     LESION EXCISION WITH COMPLEX REPAIR Right 04/29/2020   Procedure: Excision of 3 scalp cysts with complex closure;  Surgeon: Allena Napoleon, MD;  Location: [facility];  Service: Plastics;  Laterality: Right;  45 min, please   NOSE SURGERY     WISDOM TOOTH EXTRACTION     Past Surgical History:  Procedure Laterality Date   FEMUR FRACTURE SURGERY     LESION EXCISION WITH COMPLEX REPAIR Right 04/29/2020   Procedure: Excision of 3 scalp cysts with complex closure;  Surgeon: Allena Napoleon, MD;  Location:  SUR[facility]ERY C[facility];  Service: Plastics;  Laterality: Right;  45 min, please   NOSE SURGERY     WISDOM TOOTH EXTRACTION     Past Medical History:  Diagnosis Date   Attention and concentration deficit    Insomnia    Sleep apnea    mild no cpap   Traumatic brain injury    2009   BP 113/73    Pulse 78    Temp (!) 97.5 F (36.4 C) (Oral)    Ht 6\' 2"  (1.88 m)    Wt 156 lb (70.8 kg)    SpO2  97%    BMI 20.03 kg/m   Opioid Risk Score:   Fall Risk Score:  `1  Depression screen PHQ 2/9  Depression screen [facility] 2/9 11/12/2021 09/24/2021 05/14/2021 07/17/2020 05/15/2020 05/17/2019 05/09/2018  Decreased Interest 0 0 0 0 0 0 0  Down, Depressed, Hopeless 0 0 0 0 0 0 0  PHQ - 2 Score 0 0 0 0 0 0 0     Review of Systems  Musculoskeletal:  Positive for back pain.       Right knee pain  All other systems reviewed and are negative.     Objective:   Physical Exam General: No acute distress HEENT: NCAT, EOMI, oral membranes moist Cards: reg rate  Chest: normal effort Abdomen: Soft, NT, ND Skin: dry, intact Extremities: no edema Psych: pleasant and appropriate  Neuro:   processing delays. Slow to initiate nearly at baseline.. Motor 5/5. No sensory findings in either leg Musculoskeletal: LOW Back ttp esp on right side today with taut paraspinals. Pain worse with flexion  and with SLR, he has functional range of motion his lumbar spine range of motion his lumbar spine and does not appear limited with rotation lateral bending or extension.    Assessment & Plan:   1. 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 Insomnia related to above. seroquel seems to have helped 3. 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  acute exacerbation last week   Plan:   1. Klonopin for now at night.  This was not refilled today. 2. Continue trazodone at 200mg  3. Continue seroquel 50mg  at night with taper of trazodone to 100mg  and then off. Consider weaning klonopin oo                             4. Continue adderall.  He did not need refills today.                          5.  refill dicolfenac but at 75mg  dose                         6.  Wrote him a prescription for prednisone taper. `           7. Robaxin 500mg  q8 prn 8.  We will prescription for outpatient physical therapy if they can find something close to home to work on low back range of motion and strength as well as mechanics, modalities and establishment of home exercise program..  If pain worsens with therapy he really does need an MRI. 9.  Continue with heat and stretching.  I would like him to remain active although being careful to avoid lifting more than 10 pounds or so.  of face to face patient care time were spent during this visit. All questions were encouraged and answered.  Follow up with me as needed as he is moving to .      Assessment & Plan:

## 2021-11-12 NOTE — Patient Instructions (Signed)
PLEASE FEEL FREE TO CALL OUR OFFICE WITH ANY PROBLEMS OR QUESTIONS ([phone])      

## 2021-12-16 ENCOUNTER — Telehealth: Payer: Self-pay

## 2021-12-16 DIAGNOSIS — R4189 Other symptoms and signs involving cognitive functions and awareness: Secondary | ICD-10-CM

## 2021-12-16 DIAGNOSIS — F5101 Primary insomnia: Secondary | ICD-10-CM

## 2021-12-16 DIAGNOSIS — M545 Low back pain, unspecified: Secondary | ICD-10-CM

## 2021-12-16 DIAGNOSIS — G8929 Other chronic pain: Secondary | ICD-10-CM

## 2021-12-16 DIAGNOSIS — Z79899 Other long term (current) drug therapy: Secondary | ICD-10-CM

## 2021-12-16 DIAGNOSIS — F0781 Postconcussional syndrome: Secondary | ICD-10-CM

## 2021-12-16 DIAGNOSIS — M47816 Spondylosis without myelopathy or radiculopathy, lumbar region: Secondary | ICD-10-CM

## 2021-12-16 DIAGNOSIS — S069X0S Unspecified intracranial injury without loss of consciousness, sequela: Secondary | ICD-10-CM

## 2021-12-16 DIAGNOSIS — Z5181 Encounter for therapeutic drug level monitoring: Secondary | ICD-10-CM

## 2021-12-16 MED ORDER — CLONAZEPAM 1 MG PO TABS: ORAL_TABLET | ORAL | 3 refills | Status: DC

## 2021-12-16 NOTE — Addendum Note (Signed)
Addended by: Alger Simons T on: 12/16/2021 08:10 PM   Modules accepted: Orders

## 2021-12-16 NOTE — Telephone Encounter (Signed)
Patient's mother called to request refill on Clonazepam 1 mg to the [facility] on file

## 2021-12-16 NOTE — Telephone Encounter (Signed)
Med ordered

## 2022-01-19 ENCOUNTER — Telehealth: Payer: Self-pay | Admitting: *Deleted

## 2022-01-19 DIAGNOSIS — F068 Other specified mental disorders due to known physiological condition: Secondary | ICD-10-CM

## 2022-01-19 MED ORDER — AMPHETAMINE-DEXTROAMPHETAMINE 20 MG PO TABS: 20.0000 mg | ORAL_TABLET | Freq: Two times a day (BID) | ORAL | 0 refills | Status: AC

## 2022-01-19 NOTE — Telephone Encounter (Signed)
Rx's for this month and next written °

## 2022-01-19 NOTE — Telephone Encounter (Signed)
Mrs Repsher called with request for the adderall to be sent to [facility]. She has checked and they do have it in stock for now. ?

## 2022-02-11 ENCOUNTER — Ambulatory Visit: Payer: Medicaid Other | Admitting: Physical Medicine & Rehabilitation

## 2022-02-25 ENCOUNTER — Encounter: Payer: Self-pay | Admitting: Physical Medicine & Rehabilitation

## 2022-04-17 ENCOUNTER — Other Ambulatory Visit: Payer: Self-pay | Admitting: Physical Medicine & Rehabilitation

## 2022-04-17 DIAGNOSIS — Z79899 Other long term (current) drug therapy: Secondary | ICD-10-CM

## 2022-04-17 DIAGNOSIS — S069X0S Unspecified intracranial injury without loss of consciousness, sequela: Secondary | ICD-10-CM

## 2022-04-17 DIAGNOSIS — M545 Low back pain, unspecified: Secondary | ICD-10-CM

## 2022-04-17 DIAGNOSIS — Z5181 Encounter for therapeutic drug level monitoring: Secondary | ICD-10-CM

## 2022-04-17 DIAGNOSIS — M47816 Spondylosis without myelopathy or radiculopathy, lumbar region: Secondary | ICD-10-CM

## 2022-04-17 DIAGNOSIS — F5101 Primary insomnia: Secondary | ICD-10-CM

## 2022-04-17 DIAGNOSIS — F0781 Postconcussional syndrome: Secondary | ICD-10-CM
